# Patient Record
Sex: Female | Born: 1935 | Race: White | Hispanic: No | Marital: Married | Smoking: Never smoker
Health system: Southern US, Community
[De-identification: ages and names within clinical notes are randomized; demographics above are authoritative.]

## PROBLEM LIST (undated history)

## (undated) DIAGNOSIS — R3915 Urgency of urination: Secondary | ICD-10-CM

## (undated) DIAGNOSIS — M1990 Unspecified osteoarthritis, unspecified site: Secondary | ICD-10-CM

## (undated) DIAGNOSIS — I499 Cardiac arrhythmia, unspecified: Secondary | ICD-10-CM

## (undated) DIAGNOSIS — M19012 Primary osteoarthritis, left shoulder: Secondary | ICD-10-CM

## (undated) DIAGNOSIS — I341 Nonrheumatic mitral (valve) prolapse: Secondary | ICD-10-CM

## (undated) DIAGNOSIS — Z973 Presence of spectacles and contact lenses: Secondary | ICD-10-CM

## (undated) DIAGNOSIS — I639 Cerebral infarction, unspecified: Secondary | ICD-10-CM

## (undated) DIAGNOSIS — G629 Polyneuropathy, unspecified: Secondary | ICD-10-CM

## (undated) DIAGNOSIS — M19011 Primary osteoarthritis, right shoulder: Secondary | ICD-10-CM

## (undated) DIAGNOSIS — I350 Nonrheumatic aortic (valve) stenosis: Secondary | ICD-10-CM

## (undated) DIAGNOSIS — E785 Hyperlipidemia, unspecified: Secondary | ICD-10-CM

## (undated) DIAGNOSIS — I10 Essential (primary) hypertension: Secondary | ICD-10-CM

## (undated) HISTORY — PX: JOINT REPLACEMENT: SHX530

## (undated) HISTORY — PX: BACK SURGERY: SHX140

## (undated) HISTORY — PX: THROMBECTOMY: PRO61

## (undated) HISTORY — PX: COLONOSCOPY: SHX174

## (undated) HISTORY — PX: ANGIOPLASTY: SHX39

---

## 2011-05-23 DIAGNOSIS — M5020 Other cervical disc displacement, unspecified cervical region: Secondary | ICD-10-CM | POA: Diagnosis not present

## 2011-05-23 DIAGNOSIS — M999 Biomechanical lesion, unspecified: Secondary | ICD-10-CM | POA: Diagnosis not present

## 2011-05-23 DIAGNOSIS — M546 Pain in thoracic spine: Secondary | ICD-10-CM | POA: Diagnosis not present

## 2011-05-23 DIAGNOSIS — M9981 Other biomechanical lesions of cervical region: Secondary | ICD-10-CM | POA: Diagnosis not present

## 2011-05-24 DIAGNOSIS — M546 Pain in thoracic spine: Secondary | ICD-10-CM | POA: Diagnosis not present

## 2011-05-24 DIAGNOSIS — M9981 Other biomechanical lesions of cervical region: Secondary | ICD-10-CM | POA: Diagnosis not present

## 2011-05-24 DIAGNOSIS — M999 Biomechanical lesion, unspecified: Secondary | ICD-10-CM | POA: Diagnosis not present

## 2011-05-24 DIAGNOSIS — M5020 Other cervical disc displacement, unspecified cervical region: Secondary | ICD-10-CM | POA: Diagnosis not present

## 2011-05-25 DIAGNOSIS — M9981 Other biomechanical lesions of cervical region: Secondary | ICD-10-CM | POA: Diagnosis not present

## 2011-05-25 DIAGNOSIS — M5020 Other cervical disc displacement, unspecified cervical region: Secondary | ICD-10-CM | POA: Diagnosis not present

## 2011-05-25 DIAGNOSIS — M546 Pain in thoracic spine: Secondary | ICD-10-CM | POA: Diagnosis not present

## 2011-05-25 DIAGNOSIS — M999 Biomechanical lesion, unspecified: Secondary | ICD-10-CM | POA: Diagnosis not present

## 2011-05-27 DIAGNOSIS — M5020 Other cervical disc displacement, unspecified cervical region: Secondary | ICD-10-CM | POA: Diagnosis not present

## 2011-05-27 DIAGNOSIS — M999 Biomechanical lesion, unspecified: Secondary | ICD-10-CM | POA: Diagnosis not present

## 2011-05-27 DIAGNOSIS — M9981 Other biomechanical lesions of cervical region: Secondary | ICD-10-CM | POA: Diagnosis not present

## 2011-05-27 DIAGNOSIS — M546 Pain in thoracic spine: Secondary | ICD-10-CM | POA: Diagnosis not present

## 2011-05-30 DIAGNOSIS — M999 Biomechanical lesion, unspecified: Secondary | ICD-10-CM | POA: Diagnosis not present

## 2011-05-30 DIAGNOSIS — M546 Pain in thoracic spine: Secondary | ICD-10-CM | POA: Diagnosis not present

## 2011-05-30 DIAGNOSIS — M9981 Other biomechanical lesions of cervical region: Secondary | ICD-10-CM | POA: Diagnosis not present

## 2011-05-30 DIAGNOSIS — M5020 Other cervical disc displacement, unspecified cervical region: Secondary | ICD-10-CM | POA: Diagnosis not present

## 2011-05-31 DIAGNOSIS — M999 Biomechanical lesion, unspecified: Secondary | ICD-10-CM | POA: Diagnosis not present

## 2011-05-31 DIAGNOSIS — M546 Pain in thoracic spine: Secondary | ICD-10-CM | POA: Diagnosis not present

## 2011-05-31 DIAGNOSIS — M5020 Other cervical disc displacement, unspecified cervical region: Secondary | ICD-10-CM | POA: Diagnosis not present

## 2011-05-31 DIAGNOSIS — M9981 Other biomechanical lesions of cervical region: Secondary | ICD-10-CM | POA: Diagnosis not present

## 2011-06-03 DIAGNOSIS — M999 Biomechanical lesion, unspecified: Secondary | ICD-10-CM | POA: Diagnosis not present

## 2011-06-03 DIAGNOSIS — M546 Pain in thoracic spine: Secondary | ICD-10-CM | POA: Diagnosis not present

## 2011-06-03 DIAGNOSIS — M5020 Other cervical disc displacement, unspecified cervical region: Secondary | ICD-10-CM | POA: Diagnosis not present

## 2011-06-03 DIAGNOSIS — M9981 Other biomechanical lesions of cervical region: Secondary | ICD-10-CM | POA: Diagnosis not present

## 2011-06-05 DIAGNOSIS — M999 Biomechanical lesion, unspecified: Secondary | ICD-10-CM | POA: Diagnosis not present

## 2011-06-05 DIAGNOSIS — M546 Pain in thoracic spine: Secondary | ICD-10-CM | POA: Diagnosis not present

## 2011-06-05 DIAGNOSIS — M9981 Other biomechanical lesions of cervical region: Secondary | ICD-10-CM | POA: Diagnosis not present

## 2011-06-05 DIAGNOSIS — M5020 Other cervical disc displacement, unspecified cervical region: Secondary | ICD-10-CM | POA: Diagnosis not present

## 2011-06-07 DIAGNOSIS — M5020 Other cervical disc displacement, unspecified cervical region: Secondary | ICD-10-CM | POA: Diagnosis not present

## 2011-06-07 DIAGNOSIS — M546 Pain in thoracic spine: Secondary | ICD-10-CM | POA: Diagnosis not present

## 2011-06-07 DIAGNOSIS — M999 Biomechanical lesion, unspecified: Secondary | ICD-10-CM | POA: Diagnosis not present

## 2011-06-07 DIAGNOSIS — M9981 Other biomechanical lesions of cervical region: Secondary | ICD-10-CM | POA: Diagnosis not present

## 2011-06-11 DIAGNOSIS — M546 Pain in thoracic spine: Secondary | ICD-10-CM | POA: Diagnosis not present

## 2011-06-11 DIAGNOSIS — M5020 Other cervical disc displacement, unspecified cervical region: Secondary | ICD-10-CM | POA: Diagnosis not present

## 2011-06-11 DIAGNOSIS — M999 Biomechanical lesion, unspecified: Secondary | ICD-10-CM | POA: Diagnosis not present

## 2011-06-11 DIAGNOSIS — M9981 Other biomechanical lesions of cervical region: Secondary | ICD-10-CM | POA: Diagnosis not present

## 2011-06-12 DIAGNOSIS — M5020 Other cervical disc displacement, unspecified cervical region: Secondary | ICD-10-CM | POA: Diagnosis not present

## 2011-06-12 DIAGNOSIS — M546 Pain in thoracic spine: Secondary | ICD-10-CM | POA: Diagnosis not present

## 2011-06-12 DIAGNOSIS — M9981 Other biomechanical lesions of cervical region: Secondary | ICD-10-CM | POA: Diagnosis not present

## 2011-06-12 DIAGNOSIS — M999 Biomechanical lesion, unspecified: Secondary | ICD-10-CM | POA: Diagnosis not present

## 2011-06-14 DIAGNOSIS — M5020 Other cervical disc displacement, unspecified cervical region: Secondary | ICD-10-CM | POA: Diagnosis not present

## 2011-06-14 DIAGNOSIS — M9981 Other biomechanical lesions of cervical region: Secondary | ICD-10-CM | POA: Diagnosis not present

## 2011-06-14 DIAGNOSIS — M999 Biomechanical lesion, unspecified: Secondary | ICD-10-CM | POA: Diagnosis not present

## 2011-06-14 DIAGNOSIS — M546 Pain in thoracic spine: Secondary | ICD-10-CM | POA: Diagnosis not present

## 2011-06-18 DIAGNOSIS — M5020 Other cervical disc displacement, unspecified cervical region: Secondary | ICD-10-CM | POA: Diagnosis not present

## 2011-06-18 DIAGNOSIS — M999 Biomechanical lesion, unspecified: Secondary | ICD-10-CM | POA: Diagnosis not present

## 2011-06-18 DIAGNOSIS — M546 Pain in thoracic spine: Secondary | ICD-10-CM | POA: Diagnosis not present

## 2011-06-18 DIAGNOSIS — M9981 Other biomechanical lesions of cervical region: Secondary | ICD-10-CM | POA: Diagnosis not present

## 2011-06-20 DIAGNOSIS — M546 Pain in thoracic spine: Secondary | ICD-10-CM | POA: Diagnosis not present

## 2011-06-20 DIAGNOSIS — M999 Biomechanical lesion, unspecified: Secondary | ICD-10-CM | POA: Diagnosis not present

## 2011-06-20 DIAGNOSIS — M9981 Other biomechanical lesions of cervical region: Secondary | ICD-10-CM | POA: Diagnosis not present

## 2011-06-20 DIAGNOSIS — M5020 Other cervical disc displacement, unspecified cervical region: Secondary | ICD-10-CM | POA: Diagnosis not present

## 2011-06-24 DIAGNOSIS — M999 Biomechanical lesion, unspecified: Secondary | ICD-10-CM | POA: Diagnosis not present

## 2011-06-24 DIAGNOSIS — M5020 Other cervical disc displacement, unspecified cervical region: Secondary | ICD-10-CM | POA: Diagnosis not present

## 2011-06-24 DIAGNOSIS — M9981 Other biomechanical lesions of cervical region: Secondary | ICD-10-CM | POA: Diagnosis not present

## 2011-06-24 DIAGNOSIS — M546 Pain in thoracic spine: Secondary | ICD-10-CM | POA: Diagnosis not present

## 2011-06-28 DIAGNOSIS — M546 Pain in thoracic spine: Secondary | ICD-10-CM | POA: Diagnosis not present

## 2011-06-28 DIAGNOSIS — M5020 Other cervical disc displacement, unspecified cervical region: Secondary | ICD-10-CM | POA: Diagnosis not present

## 2011-06-28 DIAGNOSIS — M9981 Other biomechanical lesions of cervical region: Secondary | ICD-10-CM | POA: Diagnosis not present

## 2011-06-28 DIAGNOSIS — M999 Biomechanical lesion, unspecified: Secondary | ICD-10-CM | POA: Diagnosis not present

## 2011-07-01 DIAGNOSIS — M546 Pain in thoracic spine: Secondary | ICD-10-CM | POA: Diagnosis not present

## 2011-07-01 DIAGNOSIS — M999 Biomechanical lesion, unspecified: Secondary | ICD-10-CM | POA: Diagnosis not present

## 2011-07-01 DIAGNOSIS — M9981 Other biomechanical lesions of cervical region: Secondary | ICD-10-CM | POA: Diagnosis not present

## 2011-07-01 DIAGNOSIS — M5020 Other cervical disc displacement, unspecified cervical region: Secondary | ICD-10-CM | POA: Diagnosis not present

## 2011-07-05 DIAGNOSIS — M9981 Other biomechanical lesions of cervical region: Secondary | ICD-10-CM | POA: Diagnosis not present

## 2011-07-05 DIAGNOSIS — M546 Pain in thoracic spine: Secondary | ICD-10-CM | POA: Diagnosis not present

## 2011-07-05 DIAGNOSIS — M999 Biomechanical lesion, unspecified: Secondary | ICD-10-CM | POA: Diagnosis not present

## 2011-07-05 DIAGNOSIS — M5020 Other cervical disc displacement, unspecified cervical region: Secondary | ICD-10-CM | POA: Diagnosis not present

## 2011-07-11 DIAGNOSIS — M546 Pain in thoracic spine: Secondary | ICD-10-CM | POA: Diagnosis not present

## 2011-07-11 DIAGNOSIS — M9981 Other biomechanical lesions of cervical region: Secondary | ICD-10-CM | POA: Diagnosis not present

## 2011-07-11 DIAGNOSIS — M5020 Other cervical disc displacement, unspecified cervical region: Secondary | ICD-10-CM | POA: Diagnosis not present

## 2011-07-11 DIAGNOSIS — M999 Biomechanical lesion, unspecified: Secondary | ICD-10-CM | POA: Diagnosis not present

## 2011-07-12 DIAGNOSIS — M999 Biomechanical lesion, unspecified: Secondary | ICD-10-CM | POA: Diagnosis not present

## 2011-07-12 DIAGNOSIS — M9981 Other biomechanical lesions of cervical region: Secondary | ICD-10-CM | POA: Diagnosis not present

## 2011-07-12 DIAGNOSIS — M5020 Other cervical disc displacement, unspecified cervical region: Secondary | ICD-10-CM | POA: Diagnosis not present

## 2011-07-12 DIAGNOSIS — M546 Pain in thoracic spine: Secondary | ICD-10-CM | POA: Diagnosis not present

## 2011-07-16 DIAGNOSIS — M999 Biomechanical lesion, unspecified: Secondary | ICD-10-CM | POA: Diagnosis not present

## 2011-07-16 DIAGNOSIS — M9981 Other biomechanical lesions of cervical region: Secondary | ICD-10-CM | POA: Diagnosis not present

## 2011-07-16 DIAGNOSIS — M5020 Other cervical disc displacement, unspecified cervical region: Secondary | ICD-10-CM | POA: Diagnosis not present

## 2011-07-16 DIAGNOSIS — M546 Pain in thoracic spine: Secondary | ICD-10-CM | POA: Diagnosis not present

## 2011-07-24 DIAGNOSIS — M9981 Other biomechanical lesions of cervical region: Secondary | ICD-10-CM | POA: Diagnosis not present

## 2011-07-24 DIAGNOSIS — M5020 Other cervical disc displacement, unspecified cervical region: Secondary | ICD-10-CM | POA: Diagnosis not present

## 2011-07-24 DIAGNOSIS — M546 Pain in thoracic spine: Secondary | ICD-10-CM | POA: Diagnosis not present

## 2011-07-24 DIAGNOSIS — M999 Biomechanical lesion, unspecified: Secondary | ICD-10-CM | POA: Diagnosis not present

## 2011-07-30 DIAGNOSIS — M999 Biomechanical lesion, unspecified: Secondary | ICD-10-CM | POA: Diagnosis not present

## 2011-07-30 DIAGNOSIS — M5020 Other cervical disc displacement, unspecified cervical region: Secondary | ICD-10-CM | POA: Diagnosis not present

## 2011-07-30 DIAGNOSIS — M546 Pain in thoracic spine: Secondary | ICD-10-CM | POA: Diagnosis not present

## 2011-07-30 DIAGNOSIS — M9981 Other biomechanical lesions of cervical region: Secondary | ICD-10-CM | POA: Diagnosis not present

## 2011-08-08 DIAGNOSIS — L821 Other seborrheic keratosis: Secondary | ICD-10-CM | POA: Diagnosis not present

## 2011-08-08 DIAGNOSIS — L820 Inflamed seborrheic keratosis: Secondary | ICD-10-CM | POA: Diagnosis not present

## 2011-08-08 DIAGNOSIS — D485 Neoplasm of uncertain behavior of skin: Secondary | ICD-10-CM | POA: Diagnosis not present

## 2011-08-08 DIAGNOSIS — I781 Nevus, non-neoplastic: Secondary | ICD-10-CM | POA: Diagnosis not present

## 2011-08-08 DIAGNOSIS — L723 Sebaceous cyst: Secondary | ICD-10-CM | POA: Diagnosis not present

## 2011-08-22 DIAGNOSIS — I10 Essential (primary) hypertension: Secondary | ICD-10-CM | POA: Diagnosis not present

## 2011-08-22 DIAGNOSIS — G609 Hereditary and idiopathic neuropathy, unspecified: Secondary | ICD-10-CM | POA: Diagnosis not present

## 2011-08-22 DIAGNOSIS — E669 Obesity, unspecified: Secondary | ICD-10-CM | POA: Diagnosis not present

## 2011-08-22 DIAGNOSIS — E785 Hyperlipidemia, unspecified: Secondary | ICD-10-CM | POA: Diagnosis not present

## 2011-08-22 DIAGNOSIS — M1990 Unspecified osteoarthritis, unspecified site: Secondary | ICD-10-CM | POA: Diagnosis not present

## 2011-09-30 DIAGNOSIS — M79609 Pain in unspecified limb: Secondary | ICD-10-CM | POA: Diagnosis not present

## 2011-09-30 DIAGNOSIS — M7750 Other enthesopathy of unspecified foot: Secondary | ICD-10-CM | POA: Diagnosis not present

## 2011-09-30 DIAGNOSIS — Q667 Congenital pes cavus, unspecified foot: Secondary | ICD-10-CM | POA: Diagnosis not present

## 2011-09-30 DIAGNOSIS — G608 Other hereditary and idiopathic neuropathies: Secondary | ICD-10-CM | POA: Diagnosis not present

## 2011-10-21 DIAGNOSIS — G2581 Restless legs syndrome: Secondary | ICD-10-CM | POA: Diagnosis not present

## 2011-10-21 DIAGNOSIS — G5600 Carpal tunnel syndrome, unspecified upper limb: Secondary | ICD-10-CM | POA: Diagnosis not present

## 2011-10-28 DIAGNOSIS — Z23 Encounter for immunization: Secondary | ICD-10-CM | POA: Diagnosis not present

## 2012-01-15 HISTORY — PX: CARPAL TUNNEL RELEASE: SHX101

## 2012-02-11 DIAGNOSIS — M12519 Traumatic arthropathy, unspecified shoulder: Secondary | ICD-10-CM | POA: Diagnosis not present

## 2012-02-11 DIAGNOSIS — M5412 Radiculopathy, cervical region: Secondary | ICD-10-CM | POA: Diagnosis not present

## 2012-02-11 DIAGNOSIS — G5600 Carpal tunnel syndrome, unspecified upper limb: Secondary | ICD-10-CM | POA: Diagnosis not present

## 2012-02-20 DIAGNOSIS — M542 Cervicalgia: Secondary | ICD-10-CM | POA: Diagnosis not present

## 2012-03-02 DIAGNOSIS — G5600 Carpal tunnel syndrome, unspecified upper limb: Secondary | ICD-10-CM | POA: Diagnosis not present

## 2012-03-04 DIAGNOSIS — M659 Synovitis and tenosynovitis, unspecified: Secondary | ICD-10-CM | POA: Diagnosis not present

## 2012-03-04 DIAGNOSIS — G5600 Carpal tunnel syndrome, unspecified upper limb: Secondary | ICD-10-CM | POA: Diagnosis not present

## 2012-04-02 DIAGNOSIS — L909 Atrophic disorder of skin, unspecified: Secondary | ICD-10-CM | POA: Diagnosis not present

## 2012-04-02 DIAGNOSIS — L821 Other seborrheic keratosis: Secondary | ICD-10-CM | POA: Diagnosis not present

## 2012-04-02 DIAGNOSIS — L259 Unspecified contact dermatitis, unspecified cause: Secondary | ICD-10-CM | POA: Diagnosis not present

## 2012-04-02 DIAGNOSIS — D239 Other benign neoplasm of skin, unspecified: Secondary | ICD-10-CM | POA: Diagnosis not present

## 2012-04-02 DIAGNOSIS — L919 Hypertrophic disorder of the skin, unspecified: Secondary | ICD-10-CM | POA: Diagnosis not present

## 2012-04-02 DIAGNOSIS — L723 Sebaceous cyst: Secondary | ICD-10-CM | POA: Diagnosis not present

## 2012-04-02 DIAGNOSIS — D485 Neoplasm of uncertain behavior of skin: Secondary | ICD-10-CM | POA: Diagnosis not present

## 2012-07-06 DIAGNOSIS — R350 Frequency of micturition: Secondary | ICD-10-CM | POA: Diagnosis not present

## 2012-07-06 DIAGNOSIS — N390 Urinary tract infection, site not specified: Secondary | ICD-10-CM | POA: Diagnosis not present

## 2012-07-08 DIAGNOSIS — H04129 Dry eye syndrome of unspecified lacrimal gland: Secondary | ICD-10-CM | POA: Diagnosis not present

## 2012-07-08 DIAGNOSIS — H2510 Age-related nuclear cataract, unspecified eye: Secondary | ICD-10-CM | POA: Diagnosis not present

## 2012-07-20 DIAGNOSIS — I10 Essential (primary) hypertension: Secondary | ICD-10-CM | POA: Diagnosis not present

## 2012-07-20 DIAGNOSIS — M1990 Unspecified osteoarthritis, unspecified site: Secondary | ICD-10-CM | POA: Diagnosis not present

## 2012-07-20 DIAGNOSIS — G609 Hereditary and idiopathic neuropathy, unspecified: Secondary | ICD-10-CM | POA: Diagnosis not present

## 2012-07-20 DIAGNOSIS — E559 Vitamin D deficiency, unspecified: Secondary | ICD-10-CM | POA: Diagnosis not present

## 2012-07-20 DIAGNOSIS — Z0000 Encounter for general adult medical examination without abnormal findings: Secondary | ICD-10-CM | POA: Diagnosis not present

## 2012-07-20 DIAGNOSIS — E785 Hyperlipidemia, unspecified: Secondary | ICD-10-CM | POA: Diagnosis not present

## 2012-07-20 DIAGNOSIS — R011 Cardiac murmur, unspecified: Secondary | ICD-10-CM | POA: Diagnosis not present

## 2012-08-04 DIAGNOSIS — R011 Cardiac murmur, unspecified: Secondary | ICD-10-CM | POA: Diagnosis not present

## 2012-09-23 DIAGNOSIS — I10 Essential (primary) hypertension: Secondary | ICD-10-CM | POA: Diagnosis not present

## 2012-09-23 DIAGNOSIS — G609 Hereditary and idiopathic neuropathy, unspecified: Secondary | ICD-10-CM | POA: Diagnosis not present

## 2012-09-23 DIAGNOSIS — R011 Cardiac murmur, unspecified: Secondary | ICD-10-CM | POA: Diagnosis not present

## 2012-11-06 DIAGNOSIS — Z23 Encounter for immunization: Secondary | ICD-10-CM | POA: Diagnosis not present

## 2013-01-18 DIAGNOSIS — M19019 Primary osteoarthritis, unspecified shoulder: Secondary | ICD-10-CM | POA: Diagnosis not present

## 2013-03-26 DIAGNOSIS — Z79899 Other long term (current) drug therapy: Secondary | ICD-10-CM | POA: Diagnosis not present

## 2013-03-26 DIAGNOSIS — G609 Hereditary and idiopathic neuropathy, unspecified: Secondary | ICD-10-CM | POA: Diagnosis not present

## 2013-03-26 DIAGNOSIS — I10 Essential (primary) hypertension: Secondary | ICD-10-CM | POA: Diagnosis not present

## 2013-03-26 DIAGNOSIS — E785 Hyperlipidemia, unspecified: Secondary | ICD-10-CM | POA: Diagnosis not present

## 2013-03-30 DIAGNOSIS — M19019 Primary osteoarthritis, unspecified shoulder: Secondary | ICD-10-CM | POA: Diagnosis not present

## 2013-05-12 DIAGNOSIS — D2360 Other benign neoplasm of skin of unspecified upper limb, including shoulder: Secondary | ICD-10-CM | POA: Diagnosis not present

## 2013-05-12 DIAGNOSIS — M19019 Primary osteoarthritis, unspecified shoulder: Secondary | ICD-10-CM | POA: Diagnosis not present

## 2013-05-12 DIAGNOSIS — L821 Other seborrheic keratosis: Secondary | ICD-10-CM | POA: Diagnosis not present

## 2013-05-12 DIAGNOSIS — L723 Sebaceous cyst: Secondary | ICD-10-CM | POA: Diagnosis not present

## 2013-05-12 DIAGNOSIS — L570 Actinic keratosis: Secondary | ICD-10-CM | POA: Diagnosis not present

## 2013-07-07 DIAGNOSIS — H2510 Age-related nuclear cataract, unspecified eye: Secondary | ICD-10-CM | POA: Diagnosis not present

## 2013-07-07 DIAGNOSIS — H04129 Dry eye syndrome of unspecified lacrimal gland: Secondary | ICD-10-CM | POA: Diagnosis not present

## 2013-11-03 DIAGNOSIS — E780 Pure hypercholesterolemia: Secondary | ICD-10-CM | POA: Diagnosis not present

## 2013-11-03 DIAGNOSIS — I10 Essential (primary) hypertension: Secondary | ICD-10-CM | POA: Diagnosis not present

## 2013-11-03 DIAGNOSIS — Z79899 Other long term (current) drug therapy: Secondary | ICD-10-CM | POA: Diagnosis not present

## 2013-11-03 DIAGNOSIS — Z23 Encounter for immunization: Secondary | ICD-10-CM | POA: Diagnosis not present

## 2013-11-03 DIAGNOSIS — G609 Hereditary and idiopathic neuropathy, unspecified: Secondary | ICD-10-CM | POA: Diagnosis not present

## 2014-03-29 ENCOUNTER — Other Ambulatory Visit: Payer: Self-pay | Admitting: Orthopedic Surgery

## 2014-03-29 DIAGNOSIS — M25512 Pain in left shoulder: Secondary | ICD-10-CM | POA: Diagnosis not present

## 2014-03-30 ENCOUNTER — Ambulatory Visit
Admission: RE | Admit: 2014-03-30 | Discharge: 2014-03-30 | Disposition: A | Discharge status: Home / Self Care | Payer: Medicare Other | Source: Ambulatory Visit | Attending: Orthopedic Surgery | Admitting: Orthopedic Surgery

## 2014-03-30 DIAGNOSIS — M19012 Primary osteoarthritis, left shoulder: Secondary | ICD-10-CM | POA: Diagnosis not present

## 2014-03-30 DIAGNOSIS — M25512 Pain in left shoulder: Secondary | ICD-10-CM

## 2014-04-01 ENCOUNTER — Other Ambulatory Visit: Payer: Self-pay | Admitting: Orthopedic Surgery

## 2014-04-06 DIAGNOSIS — I10 Essential (primary) hypertension: Secondary | ICD-10-CM | POA: Diagnosis not present

## 2014-04-12 ENCOUNTER — Encounter (HOSPITAL_BASED_OUTPATIENT_CLINIC_OR_DEPARTMENT_OTHER)
Admission: RE | Admit: 2014-04-12 | Discharge: 2014-04-12 | Disposition: A | Discharge status: Home / Self Care | Payer: Medicare Other | Source: Ambulatory Visit | Attending: Orthopedic Surgery | Admitting: Orthopedic Surgery

## 2014-04-12 ENCOUNTER — Other Ambulatory Visit: Payer: Self-pay

## 2014-04-12 ENCOUNTER — Encounter (HOSPITAL_BASED_OUTPATIENT_CLINIC_OR_DEPARTMENT_OTHER): Payer: Self-pay | Admitting: *Deleted

## 2014-04-12 DIAGNOSIS — Z01812 Encounter for preprocedural laboratory examination: Secondary | ICD-10-CM | POA: Diagnosis not present

## 2014-04-12 DIAGNOSIS — M19012 Primary osteoarthritis, left shoulder: Secondary | ICD-10-CM | POA: Diagnosis not present

## 2014-04-12 DIAGNOSIS — Z01810 Encounter for preprocedural cardiovascular examination: Secondary | ICD-10-CM | POA: Diagnosis not present

## 2014-04-12 LAB — BASIC METABOLIC PANEL
Anion gap: 7 (ref 5–15)
BUN: 13 mg/dL (ref 6–23)
CO2: 28 mmol/L (ref 19–32)
CREATININE: 0.68 mg/dL (ref 0.50–1.10)
Calcium: 9.3 mg/dL (ref 8.4–10.5)
Chloride: 101 mmol/L (ref 96–112)
GFR calc Af Amer: >90 mL/min (ref >90)
GFR calc non Af Amer: 82 mL/min — ABNORMAL LOW (ref >90)
Glucose, Bld: 112 mg/dL — ABNORMAL HIGH (ref 70–99)
Potassium: 3.9 mmol/L (ref 3.5–5.1)
SODIUM: 136 mmol/L (ref 135–145)

## 2014-04-12 LAB — SURGICAL PCR SCREEN
MRSA, PCR: NEGATIVE
Staphylococcus aureus: NEGATIVE

## 2014-04-12 NOTE — Progress Notes (Signed)
Pt understands and is agreeable.  I called and spoke with Dr Sheryn Bison [facility] Doctor) he will arrange for pt to see cardiologist  He will try to get cardiac clear for surgery before surgery date.  Will call with results .     Dr Mardelle Matte advised of the above.

## 2014-04-12 NOTE — Progress Notes (Signed)
EKG reviewed by Dr Al Corpus, he spoke with pt.  Dr Crew would like to have pt seen by cards prior to surgery.

## 2014-04-19 ENCOUNTER — Other Ambulatory Visit (HOSPITAL_COMMUNITY): Payer: Self-pay | Admitting: Cardiology

## 2014-04-19 DIAGNOSIS — Z01810 Encounter for preprocedural cardiovascular examination: Secondary | ICD-10-CM | POA: Diagnosis not present

## 2014-04-19 DIAGNOSIS — E780 Pure hypercholesterolemia: Secondary | ICD-10-CM | POA: Diagnosis not present

## 2014-04-19 DIAGNOSIS — I10 Essential (primary) hypertension: Secondary | ICD-10-CM | POA: Diagnosis not present

## 2014-04-19 DIAGNOSIS — R9431 Abnormal electrocardiogram [ECG] [EKG]: Secondary | ICD-10-CM | POA: Diagnosis not present

## 2014-04-21 ENCOUNTER — Encounter (HOSPITAL_COMMUNITY)
Admission: RE | Admit: 2014-04-21 | Discharge: 2014-04-21 | Disposition: A | Discharge status: Home / Self Care | Payer: Medicare Other | Source: Ambulatory Visit | Attending: Cardiology | Admitting: Cardiology

## 2014-04-21 ENCOUNTER — Other Ambulatory Visit: Payer: Self-pay

## 2014-04-21 DIAGNOSIS — Z01810 Encounter for preprocedural cardiovascular examination: Secondary | ICD-10-CM | POA: Insufficient documentation

## 2014-04-21 DIAGNOSIS — R079 Chest pain, unspecified: Secondary | ICD-10-CM | POA: Diagnosis not present

## 2014-04-21 MED ORDER — NITROGLYCERIN 0.4 MG SL SUBL
SUBLINGUAL_TABLET | SUBLINGUAL | Status: AC
  Filled 2014-04-21: qty 1

## 2014-04-21 MED ORDER — REGADENOSON 0.4 MG/5ML IV SOLN
0.4000 mg | Freq: Once | INTRAVENOUS | Status: AC
  Administered 2014-04-21: 0.4 mg via INTRAVENOUS

## 2014-04-21 MED ORDER — REGADENOSON 0.4 MG/5ML IV SOLN
INTRAVENOUS | Status: AC
  Filled 2014-04-21: qty 5

## 2014-04-21 MED ORDER — TECHNETIUM TC 99M SESTAMIBI GENERIC - CARDIOLITE
30.0000 | Freq: Once | INTRAVENOUS | Status: AC | PRN
  Administered 2014-04-21: 30 via INTRAVENOUS

## 2014-04-21 MED ORDER — REGADENOSON 0.4 MG/5ML IV SOLN: 0.4000 mg | Freq: Once | INTRAVENOUS | Status: DC

## 2014-04-21 MED ORDER — TECHNETIUM TC 99M SESTAMIBI GENERIC - CARDIOLITE
10.0000 | Freq: Once | INTRAVENOUS | Status: AC | PRN
  Administered 2014-04-21: 10 via INTRAVENOUS

## 2014-04-22 ENCOUNTER — Ambulatory Visit (HOSPITAL_COMMUNITY): Payer: Medicare Other

## 2014-04-22 ENCOUNTER — Encounter (HOSPITAL_BASED_OUTPATIENT_CLINIC_OR_DEPARTMENT_OTHER)
Admission: RE | Disposition: A | Discharge status: Other Acute Inpatient Hospital | Payer: Self-pay | Source: Ambulatory Visit | Attending: Orthopedic Surgery

## 2014-04-22 ENCOUNTER — Encounter (HOSPITAL_BASED_OUTPATIENT_CLINIC_OR_DEPARTMENT_OTHER): Payer: Self-pay | Admitting: *Deleted

## 2014-04-22 ENCOUNTER — Ambulatory Visit (HOSPITAL_BASED_OUTPATIENT_CLINIC_OR_DEPARTMENT_OTHER): Payer: Medicare Other | Admitting: Anesthesiology

## 2014-04-22 ENCOUNTER — Ambulatory Visit (HOSPITAL_BASED_OUTPATIENT_CLINIC_OR_DEPARTMENT_OTHER)
Admission: RE | Admit: 2014-04-22 | Discharge: 2014-04-22 | Disposition: A | Discharge status: Other Acute Inpatient Hospital | Payer: Medicare Other | Source: Ambulatory Visit | Attending: Emergency Medicine | Admitting: Emergency Medicine

## 2014-04-22 DIAGNOSIS — M19012 Primary osteoarthritis, left shoulder: Secondary | ICD-10-CM | POA: Diagnosis present

## 2014-04-22 DIAGNOSIS — G9389 Other specified disorders of brain: Secondary | ICD-10-CM | POA: Insufficient documentation

## 2014-04-22 DIAGNOSIS — M1990 Unspecified osteoarthritis, unspecified site: Secondary | ICD-10-CM | POA: Diagnosis not present

## 2014-04-22 DIAGNOSIS — Z96612 Presence of left artificial shoulder joint: Secondary | ICD-10-CM

## 2014-04-22 DIAGNOSIS — R4781 Slurred speech: Secondary | ICD-10-CM | POA: Diagnosis not present

## 2014-04-22 DIAGNOSIS — Z471 Aftercare following joint replacement surgery: Secondary | ICD-10-CM | POA: Diagnosis not present

## 2014-04-22 DIAGNOSIS — Z7982 Long term (current) use of aspirin: Secondary | ICD-10-CM | POA: Diagnosis not present

## 2014-04-22 DIAGNOSIS — I10 Essential (primary) hypertension: Secondary | ICD-10-CM | POA: Insufficient documentation

## 2014-04-22 DIAGNOSIS — I639 Cerebral infarction, unspecified: Secondary | ICD-10-CM

## 2014-04-22 DIAGNOSIS — M25512 Pain in left shoulder: Secondary | ICD-10-CM | POA: Diagnosis not present

## 2014-04-22 DIAGNOSIS — Z79899 Other long term (current) drug therapy: Secondary | ICD-10-CM | POA: Insufficient documentation

## 2014-04-22 DIAGNOSIS — G319 Degenerative disease of nervous system, unspecified: Secondary | ICD-10-CM | POA: Insufficient documentation

## 2014-04-22 DIAGNOSIS — I97821 Postprocedural cerebrovascular infarction during other surgery: Secondary | ICD-10-CM | POA: Insufficient documentation

## 2014-04-22 DIAGNOSIS — I341 Nonrheumatic mitral (valve) prolapse: Secondary | ICD-10-CM | POA: Diagnosis not present

## 2014-04-22 DIAGNOSIS — Z8673 Personal history of transient ischemic attack (TIA), and cerebral infarction without residual deficits: Secondary | ICD-10-CM | POA: Insufficient documentation

## 2014-04-22 DIAGNOSIS — R531 Weakness: Secondary | ICD-10-CM | POA: Diagnosis not present

## 2014-04-22 DIAGNOSIS — G8918 Other acute postprocedural pain: Secondary | ICD-10-CM | POA: Diagnosis not present

## 2014-04-22 HISTORY — DX: Unspecified osteoarthritis, unspecified site: M19.90

## 2014-04-22 HISTORY — DX: Essential (primary) hypertension: I10

## 2014-04-22 HISTORY — DX: Nonrheumatic mitral (valve) prolapse: I34.1

## 2014-04-22 HISTORY — DX: Primary osteoarthritis, left shoulder: M19.012

## 2014-04-22 HISTORY — PX: TOTAL SHOULDER ARTHROPLASTY: SHX126

## 2014-04-22 LAB — POCT HEMOGLOBIN-HEMACUE: Hemoglobin: 18.5 g/dL — ABNORMAL HIGH (ref 12.0–15.0)

## 2014-04-22 SURGERY — ARTHROPLASTY, SHOULDER, TOTAL
Anesthesia: Regional | Site: Shoulder | Laterality: Left

## 2014-04-22 MED ORDER — FENTANYL CITRATE 0.05 MG/ML IJ SOLN: 25.0000 ug | INTRAMUSCULAR | Status: DC | PRN

## 2014-04-22 MED ORDER — OCUVITE PO TABS
1.0000 | ORAL_TABLET | Freq: Every day | ORAL | Status: DC
  Filled 2014-04-22: qty 1

## 2014-04-22 MED ORDER — LACTATED RINGERS IV SOLN
INTRAVENOUS | Status: DC
  Administered 2014-04-22 (×2): via INTRAVENOUS

## 2014-04-22 MED ORDER — METHOCARBAMOL 1000 MG/10ML IJ SOLN
500.0000 mg | Freq: Four times a day (QID) | INTRAVENOUS | Status: DC | PRN
  Filled 2014-04-22: qty 5

## 2014-04-22 MED ORDER — HYDROCODONE-ACETAMINOPHEN 10-325 MG PO TABS: 1.0000 | ORAL_TABLET | Freq: Four times a day (QID) | ORAL | Status: DC | PRN

## 2014-04-22 MED ORDER — MIDAZOLAM HCL 2 MG/2ML IJ SOLN
INTRAMUSCULAR | Status: AC
  Filled 2014-04-22: qty 2

## 2014-04-22 MED ORDER — MORPHINE SULFATE 2 MG/ML IJ SOLN: 1.0000 mg | INTRAMUSCULAR | Status: DC | PRN

## 2014-04-22 MED ORDER — EPHEDRINE SULFATE 50 MG/ML IJ SOLN
INTRAMUSCULAR | Status: DC | PRN
  Administered 2014-04-22: 15 mg via INTRAVENOUS
  Administered 2014-04-22: 10 mg via INTRAVENOUS

## 2014-04-22 MED ORDER — POLYETHYLENE GLYCOL 3350 17 G PO PACK
17.0000 g | PACK | Freq: Every day | ORAL | Status: DC | PRN
  Filled 2014-04-22: qty 1

## 2014-04-22 MED ORDER — FENTANYL CITRATE 0.05 MG/ML IJ SOLN
INTRAMUSCULAR | Status: AC
Start: 2014-04-22 — End: 2014-04-22
  Filled 2014-04-22: qty 2

## 2014-04-22 MED ORDER — ASPIRIN 81 MG PO TABS: 81.0000 mg | ORAL_TABLET | Freq: Every day | ORAL | Status: DC

## 2014-04-22 MED ORDER — OXYCODONE HCL 5 MG PO TABS: 5.0000 mg | ORAL_TABLET | Freq: Once | ORAL | Status: DC | PRN

## 2014-04-22 MED ORDER — MIDAZOLAM HCL 2 MG/2ML IJ SOLN
1.0000 mg | INTRAMUSCULAR | Status: DC | PRN
  Administered 2014-04-22: 1 mg via INTRAVENOUS

## 2014-04-22 MED ORDER — METHOCARBAMOL 500 MG PO TABS: 500.0000 mg | ORAL_TABLET | Freq: Four times a day (QID) | ORAL | Status: DC | PRN

## 2014-04-22 MED ORDER — PROPOFOL 10 MG/ML IV BOLUS
INTRAVENOUS | Status: DC | PRN
  Administered 2014-04-22: 180 mg via INTRAVENOUS
  Administered 2014-04-22: 20 mg via INTRAVENOUS

## 2014-04-22 MED ORDER — BUPIVACAINE-EPINEPHRINE (PF) 0.5% -1:200000 IJ SOLN
INTRAMUSCULAR | Status: DC | PRN
  Administered 2014-04-22: 30 mL via PERINEURAL

## 2014-04-22 MED ORDER — ONDANSETRON HCL 4 MG PO TABS: 4.0000 mg | ORAL_TABLET | Freq: Four times a day (QID) | ORAL | Status: DC | PRN

## 2014-04-22 MED ORDER — ATORVASTATIN CALCIUM 20 MG PO TABS
20.0000 mg | ORAL_TABLET | Freq: Every day | ORAL | Status: DC
  Filled 2014-04-22: qty 1

## 2014-04-22 MED ORDER — PHENYLEPHRINE HCL 10 MG/ML IJ SOLN
10.0000 mg | INTRAVENOUS | Status: DC | PRN
  Administered 2014-04-22: 50 ug/min via INTRAVENOUS

## 2014-04-22 MED ORDER — CEFAZOLIN SODIUM-DEXTROSE 2-3 GM-% IV SOLR
2.0000 g | INTRAVENOUS | Status: AC
  Administered 2014-04-22: 2 g via INTRAVENOUS

## 2014-04-22 MED ORDER — PREGABALIN 75 MG PO CAPS: 75.0000 mg | ORAL_CAPSULE | Freq: Two times a day (BID) | ORAL | Status: DC

## 2014-04-22 MED ORDER — CEFAZOLIN SODIUM-DEXTROSE 2-3 GM-% IV SOLR
INTRAVENOUS | Status: AC
  Filled 2014-04-22: qty 50

## 2014-04-22 MED ORDER — MAGNESIUM CITRATE PO SOLN: 1.0000 | Freq: Once | ORAL | Status: DC | PRN

## 2014-04-22 MED ORDER — HYDROCODONE-ACETAMINOPHEN 10-325 MG PO TABS
1.0000 | ORAL_TABLET | ORAL | Status: DC | PRN
  Administered 2014-04-22 – 2014-04-23 (×3): 1 via ORAL
  Filled 2014-04-22 (×3): qty 1

## 2014-04-22 MED ORDER — ONDANSETRON HCL 4 MG/2ML IJ SOLN
INTRAMUSCULAR | Status: DC | PRN
Start: 2014-04-22 — End: 2014-04-22
  Administered 2014-04-22: 4 mg via INTRAVENOUS

## 2014-04-22 MED ORDER — LISINOPRIL 20 MG PO TABS: 20.0000 mg | ORAL_TABLET | Freq: Every day | ORAL | Status: DC

## 2014-04-22 MED ORDER — PHENYLEPHRINE HCL 10 MG/ML IJ SOLN
INTRAMUSCULAR | Status: DC | PRN
  Administered 2014-04-22: 20 ug via INTRAVENOUS

## 2014-04-22 MED ORDER — BISACODYL 10 MG RE SUPP: 10.0000 mg | Freq: Every day | RECTAL | Status: DC | PRN

## 2014-04-22 MED ORDER — SODIUM CHLORIDE 0.9 % IV SOLN
INTRAVENOUS | Status: DC
  Administered 2014-04-22: 11:00:00 via INTRAVENOUS

## 2014-04-22 MED ORDER — SENNA-DOCUSATE SODIUM 8.6-50 MG PO TABS
2.0000 | ORAL_TABLET | Freq: Every day | ORAL | Status: DC
Start: 2014-04-22 — End: 2014-09-21

## 2014-04-22 MED ORDER — VITAMIN D 1000 UNITS PO TABS: 500.0000 [IU] | ORAL_TABLET | Freq: Every day | ORAL | Status: DC

## 2014-04-22 MED ORDER — DOCUSATE SODIUM 100 MG PO CAPS
100.0000 mg | ORAL_CAPSULE | Freq: Two times a day (BID) | ORAL | Status: DC
  Administered 2014-04-22: 100 mg via ORAL
  Filled 2014-04-22: qty 1

## 2014-04-22 MED ORDER — BACLOFEN 10 MG PO TABS: 10.0000 mg | ORAL_TABLET | Freq: Three times a day (TID) | ORAL | Status: DC

## 2014-04-22 MED ORDER — FENTANYL CITRATE 0.05 MG/ML IJ SOLN
INTRAMUSCULAR | Status: AC
  Filled 2014-04-22: qty 2

## 2014-04-22 MED ORDER — SODIUM CHLORIDE 0.9 % IV SOLN
INTRAVENOUS | Status: DC | PRN
  Administered 2014-04-22: 1000 mL

## 2014-04-22 MED ORDER — SENNA 8.6 MG PO TABS
1.0000 | ORAL_TABLET | Freq: Two times a day (BID) | ORAL | Status: DC
  Administered 2014-04-22: 8.6 mg via ORAL
  Filled 2014-04-22 (×3): qty 1

## 2014-04-22 MED ORDER — DEXAMETHASONE SODIUM PHOSPHATE 4 MG/ML IJ SOLN
INTRAMUSCULAR | Status: DC | PRN
  Administered 2014-04-22: 10 mg via INTRAVENOUS

## 2014-04-22 MED ORDER — ONDANSETRON HCL 4 MG/2ML IJ SOLN: 4.0000 mg | Freq: Four times a day (QID) | INTRAMUSCULAR | Status: DC | PRN

## 2014-04-22 MED ORDER — LIDOCAINE HCL (CARDIAC) 20 MG/ML IV SOLN
INTRAVENOUS | Status: DC | PRN
  Administered 2014-04-22: 40 mg via INTRAVENOUS

## 2014-04-22 MED ORDER — OXYCODONE HCL 5 MG/5ML PO SOLN: 5.0000 mg | Freq: Once | ORAL | Status: DC | PRN

## 2014-04-22 MED ORDER — CEFAZOLIN SODIUM 1-5 GM-% IV SOLN
1.0000 g | Freq: Four times a day (QID) | INTRAVENOUS | Status: AC
  Administered 2014-04-22 – 2014-04-23 (×3): 1 g via INTRAVENOUS

## 2014-04-22 MED ORDER — ZOLPIDEM TARTRATE 5 MG PO TABS: 5.0000 mg | ORAL_TABLET | Freq: Every evening | ORAL | Status: DC | PRN

## 2014-04-22 MED ORDER — ONDANSETRON HCL 4 MG PO TABS: 4.0000 mg | ORAL_TABLET | Freq: Three times a day (TID) | ORAL | Status: DC | PRN

## 2014-04-22 MED ORDER — HYDROCODONE-ACETAMINOPHEN 5-325 MG PO TABS
ORAL_TABLET | ORAL | Status: AC
  Filled 2014-04-22: qty 1

## 2014-04-22 MED ORDER — CEFAZOLIN SODIUM 1-5 GM-% IV SOLN
INTRAVENOUS | Status: AC
  Filled 2014-04-22: qty 50

## 2014-04-22 MED ORDER — SUCCINYLCHOLINE CHLORIDE 20 MG/ML IJ SOLN
INTRAMUSCULAR | Status: DC | PRN
  Administered 2014-04-22: 100 mg via INTRAVENOUS

## 2014-04-22 MED ORDER — FENTANYL CITRATE 0.05 MG/ML IJ SOLN
50.0000 ug | INTRAMUSCULAR | Status: DC | PRN
  Administered 2014-04-22: 50 ug via INTRAVENOUS

## 2014-04-22 MED ORDER — FENTANYL CITRATE 0.05 MG/ML IJ SOLN
INTRAMUSCULAR | Status: DC | PRN
  Administered 2014-04-22: 25 ug via INTRAVENOUS

## 2014-04-22 SURGICAL SUPPLY — 78 items
ACCESS 3.2MM STEINMAN PIN 9" ×3 IMPLANT
BLADE SAW SAG 29X58X.64 (BLADE) ×3 IMPLANT
BLADE SURG 10 STRL SS (BLADE) ×3 IMPLANT
BLADE SURG 15 STRL LF DISP TIS (BLADE) ×2 IMPLANT
BLADE SURG 15 STRL SS (BLADE) ×4
BOWL SMART MIX CTS (DISPOSABLE) IMPLANT
CAPT SHLDR TOTAL 2 ×3 IMPLANT
CEMENT HV SMART SET (Cement) ×3 IMPLANT
CLEANER CAUTERY TIP 5X5 PAD (MISCELLANEOUS) IMPLANT
CLOSURE STERI-STRIP 1/2X4 (GAUZE/BANDAGES/DRESSINGS) ×1
CLSR STERI-STRIP ANTIMIC 1/2X4 (GAUZE/BANDAGES/DRESSINGS) ×2 IMPLANT
COVER BACK TABLE 80X110 HD (DRAPES) ×3 IMPLANT
COVER MAYO STAND STRL (DRAPES) ×3 IMPLANT
DECANTER SPIKE VIAL GLASS SM (MISCELLANEOUS) IMPLANT
DRAPE INCISE IOBAN 66X45 STRL (DRAPES) IMPLANT
DRAPE SURG 17X23 STRL (DRAPES) ×3 IMPLANT
DRAPE U 20/CS (DRAPES) ×3 IMPLANT
DRAPE U-SHAPE 47X51 STRL (DRAPES) ×3 IMPLANT
DRAPE U-SHAPE 76X120 STRL (DRAPES) ×6 IMPLANT
DRSG MEPILEX BORDER 4X8 (GAUZE/BANDAGES/DRESSINGS) ×3 IMPLANT
DURAPREP 26ML APPLICATOR (WOUND CARE) ×3 IMPLANT
ELECT BLADE 6.5 .24CM SHAFT (ELECTRODE) IMPLANT
ELECT REM PT RETURN 9FT ADLT (ELECTROSURGICAL) ×3
ELECTRODE REM PT RTRN 9FT ADLT (ELECTROSURGICAL) ×1 IMPLANT
FACESHIELD WRAPAROUND (MASK) ×6 IMPLANT
GAUZE SPONGE 4X4 12PLY STRL (GAUZE/BANDAGES/DRESSINGS) ×3 IMPLANT
GLOVE BIO SURGEON STRL SZ8 (GLOVE) ×3 IMPLANT
GLOVE BIOGEL PI IND STRL 7.0 (GLOVE) ×3 IMPLANT
GLOVE BIOGEL PI IND STRL 8 (GLOVE) ×2 IMPLANT
GLOVE BIOGEL PI INDICATOR 7.0 (GLOVE) ×6
GLOVE BIOGEL PI INDICATOR 8 (GLOVE) ×4
GLOVE ECLIPSE 6.5 STRL STRAW (GLOVE) ×9 IMPLANT
GLOVE ORTHO TXT STRL SZ7.5 (GLOVE) ×3 IMPLANT
GOWN STRL REUS W/ TWL LRG LVL3 (GOWN DISPOSABLE) ×2 IMPLANT
GOWN STRL REUS W/ TWL XL LVL3 (GOWN DISPOSABLE) ×2 IMPLANT
GOWN STRL REUS W/TWL LRG LVL3 (GOWN DISPOSABLE) ×4
GOWN STRL REUS W/TWL XL LVL3 (GOWN DISPOSABLE) ×4
HANDPIECE INTERPULSE COAX TIP (DISPOSABLE)
MANIFOLD NEPTUNE II (INSTRUMENTS) ×3 IMPLANT
NDL SUT 6 .5 CRC .975X.05 MAYO (NEEDLE) ×1 IMPLANT
NEEDLE MAYO TAPER (NEEDLE) ×2
NS IRRIG 1000ML POUR BTL (IV SOLUTION) ×3 IMPLANT
PACK ARTHROSCOPY DSU (CUSTOM PROCEDURE TRAY) ×3 IMPLANT
PACK BASIN DAY SURGERY FS (CUSTOM PROCEDURE TRAY) ×3 IMPLANT
PAD CLEANER CAUTERY TIP 5X5 (MISCELLANEOUS)
PENCIL BUTTON HOLSTER BLD 10FT (ELECTRODE) ×3 IMPLANT
RETRIEVER SUT HEWSON (MISCELLANEOUS) IMPLANT
SET HNDPC FAN SPRY TIP SCT (DISPOSABLE) IMPLANT
SHEET MEDIUM DRAPE 40X70 STRL (DRAPES) ×3 IMPLANT
SLEEVE SCD COMPRESS KNEE MED (MISCELLANEOUS) ×3 IMPLANT
SLING ARM IMMOBILIZER LRG (SOFTGOODS) IMPLANT
SLING ARM IMMOBILIZER MED (SOFTGOODS) IMPLANT
SLING ARM LRG ADULT FOAM STRAP (SOFTGOODS) ×3 IMPLANT
SLING ARM MED ADULT FOAM STRAP (SOFTGOODS) IMPLANT
SLING ARM XL FOAM STRAP (SOFTGOODS) IMPLANT
SMARTMIX MINI TOWER (MISCELLANEOUS) ×3
SPONGE LAP 18X18 X RAY DECT (DISPOSABLE) ×3 IMPLANT
SPONGE LAP 4X18 X RAY DECT (DISPOSABLE) ×3 IMPLANT
SUCTION FRAZIER TIP 10 FR DISP (SUCTIONS) ×3 IMPLANT
SUPPORT WRAP ARM LG (MISCELLANEOUS) ×3 IMPLANT
SUT FIBERWIRE #2 38 T-5 BLUE (SUTURE) ×6
SUT MNCRL AB 4-0 PS2 18 (SUTURE) IMPLANT
SUT VIC AB 0 CT1 18XCR BRD 8 (SUTURE) IMPLANT
SUT VIC AB 0 CT1 27 (SUTURE) ×2
SUT VIC AB 0 CT1 27XBRD ANBCTR (SUTURE) ×1 IMPLANT
SUT VIC AB 0 CT1 8-18 (SUTURE)
SUT VIC AB 2-0 SH 27 (SUTURE)
SUT VIC AB 2-0 SH 27XBRD (SUTURE) IMPLANT
SUT VICRYL 3-0 CR8 SH (SUTURE) ×3 IMPLANT
SUTURE FIBERWR #2 38 T-5 BLUE (SUTURE) ×2 IMPLANT
SYR BULB IRRIGATION 50ML (SYRINGE) ×3 IMPLANT
TAPE STRIPS DRAPE STRL (GAUZE/BANDAGES/DRESSINGS) IMPLANT
TOWEL OR 17X24 6PK STRL BLUE (TOWEL DISPOSABLE) ×6 IMPLANT
TOWEL OR NON WOVEN STRL DISP B (DISPOSABLE) ×6 IMPLANT
TOWER SMARTMIX MINI (MISCELLANEOUS) ×1 IMPLANT
TUBE CONNECTING 20'X1/4 (TUBING) ×1
TUBE CONNECTING 20X1/4 (TUBING) ×2 IMPLANT
YANKAUER SUCT BULB TIP NO VENT (SUCTIONS) ×3 IMPLANT

## 2014-04-22 NOTE — Transfer of Care (Signed)
Immediate Anesthesia Transfer of Care Note  Patient: Peggy [name]  Procedure(s) Performed: Procedure(s): LEFT TOTAL SHOULDER ARTHROPLASTY (Left)  Patient Location: PACU  Anesthesia Type:General  Level of Consciousness: awake and sedated  Airway & Oxygen Therapy: Patient Spontanous Breathing and Patient connected to face mask oxygen  Post-op Assessment: Report given to RN and Post -op Vital signs reviewed and stable  Post vital signs: Reviewed and stable  Last Vitals:  Filed Vitals:   04/22/14 0720  BP: 109/67  Pulse: 77  Temp:   Resp: 19    Complications: No apparent anesthesia complications

## 2014-04-22 NOTE — Op Note (Signed)
04/22/2014  9:43 AM  PATIENT:  Peggy [name]    PRE-OPERATIVE DIAGNOSIS:  PRIMARY OSTEOARTHRITIS LEFT SHOULDER   POST-OPERATIVE DIAGNOSIS:  Same  PROCEDURE:  LEFT TOTAL SHOULDER ARTHROPLASTY  SURGEON:  Johnny Bridge, MD  PHYSICIAN ASSISTANT: Joya Gaskins, OPA-C, present and scrubbed throughout the case, critical for completion in a timely fashion, and for retraction, instrumentation, and closure.  ANESTHESIA:   General  PREOPERATIVE INDICATIONS:  Peggy [name] is a  80 y.o. female with a diagnosis of PRIMARY OSTEOARTHRITIS LEFT SHOULDER  who failed conservative measures and elected for surgical management.    The risks benefits and alternatives were discussed with the patient preoperatively including but not limited to the risks of infection, bleeding, nerve injury, cardiopulmonary complications, the need for revision surgery, dislocation, loosening, incomplete relief of pain, among others, and the patient was willing to proceed.   OPERATIVE IMPLANTS: Biomet size 10 mini press-fit humeral stem, size 46+18 Versa-dial humeral head, set in the A position with increased coverage superiorly, with a small cemented glenoid polyethylene 3 peg implant with a central regenerex noncemented post.   OPERATIVE FINDINGS: Advanced glenohumeral osteoarthritis involving the glenoid and the humeral head with substantial osteophyte formation inferiorly.The capsular tissue and subscapularis was very mobile..The glenoid bone was very sclerotic.   OPERATIVE PROCEDURE: The patient was brought to the operating room and placed in the supine position. General anesthesia was administered. IV antibiotics were given.  The upper extremity was prepped and draped in usual sterile fashion. The patient was in a beachchair position with all bony prominences padded.   Time out was performed and a deltopectoral approach was carried out. The biceps tendon was tenodesed to the pectoralis tendon. The subscapularis was  released, tagging it with a #2 MaxBraid, leaving a cuff of tendon for repair.   The inferior osteophyte was removed, and release of the capsule off of the humeral side was completed. The head was dislocated, and I reamed sequentially. I placed the humeral cutting guide at 30 of retroversion, and then pinned this into place, and made my humeral neck cut. This was at the appropriate level.   I then placed deep retractors and exposed the glenoid. I excised the labrum circumferentially, taking care to protect the axillary nerve inferiorly.   I then placed a guidewire into the center position, controlling appropriate version and inclination. I then reamed over the guidewire with the small reamer, and was satisfied with the preparation. I preserved the subchondral bone in order to maximize the strength and minimize the risk for subsequent subsidence.   I then drilled the central hole for the regenerex peg, and then placed the guide, and then drilled the 3 peripheral peg holes. I had excellent bony circumferential contact.   I then cleaned the glenoid, irrigated it copiously, and then dried it and cemented the prosthesis into place. Excellent seating was achieved. I had full exposure. I turned my attention to the humeral side.   I sequentially broached, up to the selected size, with the broach set at 30 of retroversion. I then placed the real stem. I trialed with multiple heads, and the above-named component was selected. Increased posterior coverage improved the coverage. The soft tissue tension was appropriate.   I then impacted the real humeral head into place, reduced the head, and irrigated copiously. Excellent stability and range of motion was achieved. I repaired the subscapularis through tendon with 5#2 FiberWire, as well as the rotator interval, and irrigated copiously once more. The subcutaneous tissue was closed  with Vicryl including the deltopectoral fascia.   The skin was closed with  Steri-Strips and sterile gauze was applied. She had a preoperative nerve block. She tolerated the procedure well and there were no complications.

## 2014-04-22 NOTE — Progress Notes (Signed)
Assisted Dr. Hodierne with left, ultrasound guided, interscalene  block. Side rails up, monitors on throughout procedure. See vital signs in flow sheet. Tolerated Procedure well. 

## 2014-04-22 NOTE — Discharge Instructions (Signed)
Diet: As you were doing prior to hospitalization  ° °Shower:  May shower but keep the wounds dry, use an occlusive plastic wrap, NO SOAKING IN TUB.  If the bandage gets wet, change with a clean dry gauze. ° °Dressing:  You may change your dressing 3-5 days after surgery.  Then change the dressing daily with sterile gauze dressing.   ° °There are sticky tapes (steri-strips) on your wounds and all the stitches are absorbable.  Leave the steri-strips in place when changing your dressings, they will peel off with time, usually 2-3 weeks. ° °Activity:  Increase activity slowly as tolerated, but follow the weight bearing instructions below.  No lifting or driving for 6 weeks. ° °Weight Bearing:   Sling at all times..   ° °To prevent constipation: you may use a stool softener such as - ° °Colace (over the counter) 100 mg by mouth twice a day  °Drink plenty of fluids (prune juice may be helpful) and high fiber foods °Miralax (over the counter) for constipation as needed.   ° °Itching:  If you experience itching with your medications, try taking only a single pain pill, or even half a pain pill at a time.  You may take up to 10 pain pills per day, and you can also use benadryl over the counter for itching or also to help with sleep.  ° °Precautions:  If you experience chest pain or shortness of breath - call 911 immediately for transfer to the hospital emergency department!! ° °If you develop a fever greater that 101 F, purulent drainage from wound, increased redness or drainage from wound, or calf pain -- Call the office at [phone]                                                °Follow- Up Appointment:  Please call for an appointment to be seen in 2 weeks [place] - [phone] ° ° ° ° ° °

## 2014-04-22 NOTE — Anesthesia Procedure Notes (Addendum)
Anesthesia Regional Block:  Interscalene brachial plexus block  Pre-Anesthetic Checklist: ,, timeout performed, Correct Patient, Correct Site, Correct Laterality, Correct Procedure, Correct Position, site marked, Risks and benefits discussed,  Surgical consent,  Pre-op evaluation,  At surgeon's request and post-op pain management  Laterality: Left  Prep: chloraprep       Needles:  Injection technique: Single-shot  Needle Type: Echogenic Stimulator Needle     Needle Length: 5cm 5 cm Needle Gauge: 22 and 22 G    Additional Needles:  Procedures: ultrasound guided (picture in chart) and nerve stimulator Interscalene brachial plexus block  Nerve Stimulator or Paresthesia:  Response: biceps flexion, 0.45 mA,   Additional Responses:   Narrative:  Start time: 04/22/2014 7:03 AM End time: 04/22/2014 7:18 AM Injection made incrementally with aspirations every 5 mL.  Performed by: Personally  Anesthesiologist: HODIERNE, ADAM  Additional Notes: Functioning IV was confirmed and monitors were applied.  A 58mm 22ga Arrow echogenic stimulator needle was used. Sterile prep and drape,hand hygiene and sterile gloves were used.  Negative aspiration and negative test dose prior to incremental administration of local anesthetic. The patient tolerated the procedure well.  Ultrasound guidance: relevent anatomy identified, needle position confirmed, local anesthetic spread visualized around nerve(s), vascular puncture avoided.  Image printed for medical record.    Procedure Name: Intubation Performed by: Terrance Mass Pre-anesthesia Checklist: Patient identified, Timeout performed, Emergency Drugs available, Suction available and Patient being monitored Patient Re-evaluated:Patient Re-evaluated prior to inductionOxygen Delivery Method: Circle system utilized Preoxygenation: Pre-oxygenation with 100% oxygen Intubation Type: IV induction Ventilation: Mask ventilation without  difficulty Laryngoscope Size: Miller and 2 Grade View: Grade II Tube type: Oral Tube size: 7.0 mm Number of attempts: 1 Airway Equipment and Method: Stylet Placement Confirmation: ETT inserted through vocal cords under direct vision,  breath sounds checked- equal and bilateral and positive ETCO2 Secured at: 22 cm Tube secured with: Tape Dental Injury: Teeth and Oropharynx as per pre-operative assessment

## 2014-04-22 NOTE — Anesthesia Preprocedure Evaluation (Signed)
Anesthesia Evaluation  Patient identified by MRN, date of birth, ID band Patient awake    Reviewed: Allergy & Precautions, NPO status , Patient's Chart, lab work & pertinent test results  Airway Mallampati: II   Neck ROM: full    Dental   Pulmonary neg pulmonary ROS,  breath sounds clear to auscultation        Cardiovascular hypertension, Rhythm:regular Rate:Normal     Neuro/Psych    GI/Hepatic   Endo/Other    Renal/GU      Musculoskeletal  (+) Arthritis -,   Abdominal   Peds  Hematology   Anesthesia Other Findings   Reproductive/Obstetrics                             Anesthesia Physical Anesthesia Plan  ASA: II  Anesthesia Plan: General and Regional   Post-op Pain Management: MAC Combined w/ Regional for Post-op pain   Induction: Intravenous  Airway Management Planned: Oral ETT  Additional Equipment:   Intra-op Plan:   Post-operative Plan: Extubation in OR  Informed Consent: I have reviewed the patients History and Physical, chart, labs and discussed the procedure including the risks, benefits and alternatives for the proposed anesthesia with the patient or authorized representative who has indicated his/her understanding and acceptance.     Plan Discussed with: CRNA, Anesthesiologist and Surgeon  Anesthesia Plan Comments:         Anesthesia Quick Evaluation

## 2014-04-22 NOTE — Anesthesia Postprocedure Evaluation (Signed)
Anesthesia Post Note  Patient: Peggy [name]  Procedure(s) Performed: Procedure(s) (LRB): LEFT TOTAL SHOULDER ARTHROPLASTY (Left)  Anesthesia type: General  Patient location: PACU  Post pain: Pain level controlled and Adequate analgesia  Post assessment: Post-op Vital signs reviewed, Patient's Cardiovascular Status Stable, Respiratory Function Stable, Patent Airway and Pain level controlled  Last Vitals:  Filed Vitals:   04/22/14 1115  BP: 109/65  Pulse: 90  Temp: 36.1 C  Resp: 20    Post vital signs: Reviewed and stable  Level of consciousness: awake, alert  and oriented  Complications: No apparent anesthesia complications

## 2014-04-22 NOTE — H&P (Signed)
PREOPERATIVE H&P  Chief Complaint: PRIMARY OSTEOARTHRITIS LEFT SHOULDER   HPI: Peggy [name] is a 80 y.o. female who presents for preoperative history and physical with a diagnosis of PRIMARY OSTEOARTHRITIS LEFT SHOULDER . Symptoms are rated as moderate to severe, and have been worsening.  This is significantly impairing activities of daily living.  She has elected for surgical management.   She has failed injections, activity modification, anti-inflammatories, and assistive devices.  Preoperative X-rays demonstrate end stage degenerative changes with osteophyte formation, loss of joint space, subchondral sclerosis.   Past Medical History  Diagnosis Date  . Hypertension   . Arthritis   . Mitral valve prolapse     takes antiabiotic with procedures   Past Surgical History  Procedure Laterality Date  . Joint replacement      bilat knees  . Back surgery      L5 removed 1986  . Carpal tunnel release Right 2014   History   Social History  . Marital Status: Married    Spouse Name: N/A  . Number of Children: N/A  . Years of Education: N/A   Social History Main Topics  . Smoking status: Never Smoker   . Smokeless tobacco: Never Used  . Alcohol Use: Yes     Comment: occasionally  . Drug Use: No  . Sexual Activity: Yes   Other Topics Concern  . None   Social History Narrative   History reviewed. No pertinent family history. No Known Allergies Prior to Admission medications   Medication Sig Start Date End Date Taking? Authorizing Provider  aspirin 81 MG tablet Take 81 mg by mouth daily.   Yes Historical Provider, MD  atorvastatin (LIPITOR) 20 MG tablet Take 20 mg by mouth daily.   Yes Historical Provider, MD  beta carotene w/minerals (OCUVITE) tablet Take 1 tablet by mouth daily.   Yes Historical Provider, MD  calcium carbonate 200 MG capsule Take 250 mg by mouth 2 (two) times daily with a meal.   Yes Historical Provider, MD  cholecalciferol (VITAMIN D) 1000 UNITS  tablet Take 500 Units by mouth daily.   Yes Historical Provider, MD  glucosamine-chondroitin 500-400 MG tablet Take 1 tablet by mouth 4 (four) times daily.   Yes Historical Provider, MD  lisinopril (PRINIVIL,ZESTRIL) 20 MG tablet Take 20 mg by mouth daily.   Yes Historical Provider, MD  pregabalin (LYRICA) 75 MG capsule Take 75 mg by mouth 2 (two) times daily.   Yes Historical Provider, MD  vitamin A 7500 UNIT capsule Take 7,500 Units by mouth daily.   Yes Historical Provider, MD     Positive ROS: All other systems have been reviewed and were otherwise negative with the exception of those mentioned in the HPI and as above.  Physical Exam: General: Alert, no acute distress Cardiovascular: No pedal edema Respiratory: No cyanosis, no use of accessory musculature GI: No organomegaly, abdomen is soft and non-tender Skin: No lesions in the area of chief complaint Neurologic: Sensation intact distally Psychiatric: Patient is competent for consent with normal mood and affect Lymphatic: No axillary or cervical lymphadenopathy  MUSCULOSKELETAL: Left shoulder active motion is 0-165 with 10 of external rotation with positive crepitance and intact rotator cuff strength.  Assessment: PRIMARY OSTEOARTHRITIS LEFT SHOULDER   Plan: Plan for Procedure(s): LEFT TOTAL SHOULDER ARTHROPLASTY  The risks benefits and alternatives were discussed with the patient including but not limited to the risks of nonoperative treatment, versus surgical intervention including infection, bleeding, nerve injury,  blood clots, cardiopulmonary complications, morbidity,  mortality, among others, and they were willing to proceed.   Johnny Bridge, MD Cell [phone]   04/22/2014 7:09 AM

## 2014-04-22 NOTE — Progress Notes (Signed)
Dr Mardelle Matte aware of sling no immobilizer on.  States did not need to change

## 2014-04-23 ENCOUNTER — Emergency Department (HOSPITAL_COMMUNITY)
Admission: EM | Admit: 2014-04-23 | Discharge: 2014-04-23 | Disposition: A | Discharge status: Home / Self Care | Payer: Medicare Other | Attending: Emergency Medicine | Admitting: Emergency Medicine

## 2014-04-23 ENCOUNTER — Emergency Department (HOSPITAL_COMMUNITY): Payer: Medicare Other

## 2014-04-23 ENCOUNTER — Encounter (HOSPITAL_COMMUNITY): Payer: Self-pay | Admitting: Emergency Medicine

## 2014-04-23 DIAGNOSIS — I63032 Cerebral infarction due to thrombosis of left carotid artery: Secondary | ICD-10-CM | POA: Diagnosis not present

## 2014-04-23 DIAGNOSIS — Z79899 Other long term (current) drug therapy: Secondary | ICD-10-CM | POA: Insufficient documentation

## 2014-04-23 DIAGNOSIS — M25512 Pain in left shoulder: Secondary | ICD-10-CM | POA: Diagnosis not present

## 2014-04-23 DIAGNOSIS — I10 Essential (primary) hypertension: Secondary | ICD-10-CM | POA: Insufficient documentation

## 2014-04-23 DIAGNOSIS — G8194 Hemiplegia, unspecified affecting left nondominant side: Secondary | ICD-10-CM | POA: Diagnosis not present

## 2014-04-23 DIAGNOSIS — Z96612 Presence of left artificial shoulder joint: Secondary | ICD-10-CM | POA: Diagnosis not present

## 2014-04-23 DIAGNOSIS — I639 Cerebral infarction, unspecified: Secondary | ICD-10-CM | POA: Diagnosis not present

## 2014-04-23 DIAGNOSIS — I63031 Cerebral infarction due to thrombosis of right carotid artery: Secondary | ICD-10-CM | POA: Diagnosis not present

## 2014-04-23 DIAGNOSIS — I959 Hypotension, unspecified: Secondary | ICD-10-CM | POA: Diagnosis not present

## 2014-04-23 DIAGNOSIS — I6521 Occlusion and stenosis of right carotid artery: Secondary | ICD-10-CM | POA: Diagnosis not present

## 2014-04-23 DIAGNOSIS — J189 Pneumonia, unspecified organism: Secondary | ICD-10-CM | POA: Diagnosis not present

## 2014-04-23 DIAGNOSIS — J90 Pleural effusion, not elsewhere classified: Secondary | ICD-10-CM | POA: Diagnosis not present

## 2014-04-23 DIAGNOSIS — R531 Weakness: Secondary | ICD-10-CM | POA: Diagnosis present

## 2014-04-23 DIAGNOSIS — Z7982 Long term (current) use of aspirin: Secondary | ICD-10-CM | POA: Insufficient documentation

## 2014-04-23 DIAGNOSIS — I63511 Cerebral infarction due to unspecified occlusion or stenosis of right middle cerebral artery: Secondary | ICD-10-CM | POA: Diagnosis not present

## 2014-04-23 DIAGNOSIS — M1990 Unspecified osteoarthritis, unspecified site: Secondary | ICD-10-CM | POA: Insufficient documentation

## 2014-04-23 DIAGNOSIS — R918 Other nonspecific abnormal finding of lung field: Secondary | ICD-10-CM | POA: Diagnosis not present

## 2014-04-23 DIAGNOSIS — Z0189 Encounter for other specified special examinations: Secondary | ICD-10-CM | POA: Diagnosis not present

## 2014-04-23 DIAGNOSIS — J811 Chronic pulmonary edema: Secondary | ICD-10-CM | POA: Diagnosis not present

## 2014-04-23 DIAGNOSIS — D649 Anemia, unspecified: Secondary | ICD-10-CM | POA: Diagnosis not present

## 2014-04-23 DIAGNOSIS — I63311 Cerebral infarction due to thrombosis of right middle cerebral artery: Secondary | ICD-10-CM | POA: Diagnosis not present

## 2014-04-23 DIAGNOSIS — I6601 Occlusion and stenosis of right middle cerebral artery: Secondary | ICD-10-CM | POA: Diagnosis not present

## 2014-04-23 DIAGNOSIS — I341 Nonrheumatic mitral (valve) prolapse: Secondary | ICD-10-CM | POA: Diagnosis not present

## 2014-04-23 HISTORY — DX: Cerebral infarction, unspecified: I63.9

## 2014-04-23 LAB — COMPREHENSIVE METABOLIC PANEL
ALT: 18 U/L (ref 0–35)
AST: 23 U/L (ref 0–37)
Albumin: 3.5 g/dL (ref 3.5–5.2)
Alkaline Phosphatase: 57 U/L (ref 39–117)
Anion gap: 6 (ref 5–15)
BUN: 15 mg/dL (ref 6–23)
CALCIUM: 8.8 mg/dL (ref 8.4–10.5)
CO2: 29 mmol/L (ref 19–32)
CREATININE: 0.73 mg/dL (ref 0.50–1.10)
Chloride: 102 mmol/L (ref 96–112)
GFR calc Af Amer: >90 mL/min (ref >90)
GFR calc non Af Amer: 80 mL/min — ABNORMAL LOW (ref >90)
GLUCOSE: 121 mg/dL — AB (ref 70–99)
POTASSIUM: 4.3 mmol/L (ref 3.5–5.1)
Sodium: 137 mmol/L (ref 135–145)
Total Bilirubin: 0.5 mg/dL (ref 0.3–1.2)
Total Protein: 6.7 g/dL (ref 6.0–8.3)

## 2014-04-23 LAB — PROTIME-INR
INR: 1.05 (ref 0.00–1.49)
Prothrombin Time: 13.8 seconds (ref 11.6–15.2)

## 2014-04-23 LAB — DIFFERENTIAL
BASOS ABS: 0 10*3/uL (ref 0.0–0.1)
Basophils Relative: 0 % (ref 0–1)
EOS ABS: 0 10*3/uL (ref 0.0–0.7)
Eosinophils Relative: 0 % (ref 0–5)
Lymphocytes Relative: 11 % — ABNORMAL LOW (ref 12–46)
Lymphs Abs: 1.7 10*3/uL (ref 0.7–4.0)
Monocytes Absolute: 1.8 10*3/uL — ABNORMAL HIGH (ref 0.1–1.0)
Monocytes Relative: 11 % (ref 3–12)
NEUTROS ABS: 12.7 10*3/uL — AB (ref 1.7–7.7)
NEUTROS PCT: 78 % — AB (ref 43–77)

## 2014-04-23 LAB — I-STAT CHEM 8, ED
BUN: 17 mg/dL (ref 6–23)
Calcium, Ion: 1.12 mmol/L — ABNORMAL LOW (ref 1.13–1.30)
Chloride: 101 mmol/L (ref 96–112)
Creatinine, Ser: 0.7 mg/dL (ref 0.50–1.10)
GLUCOSE: 125 mg/dL — AB (ref 70–99)
HEMATOCRIT: 43 % (ref 36.0–46.0)
Hemoglobin: 14.6 g/dL (ref 12.0–15.0)
Potassium: 4.3 mmol/L (ref 3.5–5.1)
Sodium: 139 mmol/L (ref 135–145)
TCO2: 25 mmol/L (ref 0–100)

## 2014-04-23 LAB — ETHANOL: Alcohol, Ethyl (B): <5 mg/dL (ref 0–9)

## 2014-04-23 LAB — CBC
HCT: 41.5 % (ref 36.0–46.0)
Hemoglobin: 13.5 g/dL (ref 12.0–15.0)
MCH: 29.7 pg (ref 26.0–34.0)
MCHC: 32.5 g/dL (ref 30.0–36.0)
MCV: 91.2 fL (ref 78.0–100.0)
Platelets: 272 10*3/uL (ref 150–400)
RBC: 4.55 MIL/uL (ref 3.87–5.11)
RDW: 12.9 % (ref 11.5–15.5)
WBC: 16.2 10*3/uL — ABNORMAL HIGH (ref 4.0–10.5)

## 2014-04-23 LAB — APTT: aPTT: 25 seconds (ref 24–37)

## 2014-04-23 LAB — I-STAT TROPONIN, ED: Troponin i, poc: 0 ng/mL (ref 0.00–0.08)

## 2014-04-23 LAB — CBG MONITORING, ED: GLUCOSE-CAPILLARY: 127 mg/dL — AB (ref 70–99)

## 2014-04-23 MED ORDER — ASPIRIN 81 MG PO CHEW: 81.0000 mg | CHEWABLE_TABLET | Freq: Every day | ORAL | Status: DC

## 2014-04-23 NOTE — ED Provider Notes (Signed)
CSN: [id number]     Arrival date & time 04/23/14  0542 History   First MD Initiated Contact with Patient 04/23/14 0544     Chief Complaint  Patient presents with  . Code Stroke     (Consider location/radiation/quality/duration/timing/severity/associated sxs/prior Treatment) HPI Patient with complete left shoulder replacement surgery yesterday presents with acute onset left sided weakness starting at 4:55 AM. Patient is nonverbal at this time. Level V caveat applies. Past Medical History  Diagnosis Date  . Hypertension   . Arthritis   . Mitral valve prolapse     takes antiabiotic with procedures  . Osteoarthritis of left shoulder, primary localized 04/22/2014   Past Surgical History  Procedure Laterality Date  . Joint replacement      bilat knees  . Back surgery      L5 removed 1986  . Carpal tunnel release Right 2014   History reviewed. No pertinent family history. History  Substance Use Topics  . Smoking status: Never Smoker   . Smokeless tobacco: Never Used  . Alcohol Use: Yes     Comment: occasionally   OB History    No data available     Review of Systems  Unable to perform ROS     Allergies  Review of patient's allergies indicates no known allergies.  Home Medications   Prior to Admission medications   Medication Sig Start Date End Date Taking? Authorizing Provider  aspirin 81 MG tablet Take 81 mg by mouth daily.   Yes Historical Provider, MD  atorvastatin (LIPITOR) 20 MG tablet Take 20 mg by mouth daily.   Yes Historical Provider, MD  beta carotene w/minerals (OCUVITE) tablet Take 1 tablet by mouth daily.   Yes Historical Provider, MD  calcium carbonate 200 MG capsule Take 250 mg by mouth 2 (two) times daily with a meal.   Yes Historical Provider, MD  cholecalciferol (VITAMIN D) 1000 UNITS tablet Take 500 Units by mouth daily.   Yes Historical Provider, MD  glucosamine-chondroitin 500-400 MG tablet Take 1 tablet by mouth 4 (four) times daily.   Yes  Historical Provider, MD  lisinopril (PRINIVIL,ZESTRIL) 20 MG tablet Take 20 mg by mouth daily.   Yes Historical Provider, MD  metoprolol succinate (TOPROL-XL) 25 MG 24 hr tablet Take 25 mg by mouth daily.  04/19/14  Yes Historical Provider, MD  pregabalin (LYRICA) 75 MG capsule Take 75 mg by mouth 2 (two) times daily.   Yes Historical Provider, MD  vitamin A 7500 UNIT capsule Take 7,500 Units by mouth daily.   Yes Historical Provider, MD  baclofen (LIORESAL) 10 MG tablet Take 1 tablet (10 mg total) by mouth 3 (three) times daily. As needed for muscle spasm 04/22/14   Marchia Bond, MD  HYDROcodone-acetaminophen [facility]) 10-325 MG per tablet Take 1-2 tablets by mouth every 6 (six) hours as needed. 04/22/14   Marchia Bond, MD  ondansetron (ZOFRAN) 4 MG tablet Take 1 tablet (4 mg total) by mouth every 8 (eight) hours as needed for nausea or vomiting. 04/22/14   Marchia Bond, MD  sennosides-docusate sodium (SENOKOT-S) 8.6-50 MG tablet Take 2 tablets by mouth daily. 04/22/14   Marchia Bond, MD   BP 119/57 mmHg  Pulse 89  Temp(Src) 97.1 F (36.2 C) (Oral)  Resp 17  Ht 5\' 8"  (1.727 m)  Wt 220 lb (99.791 kg)  BMI 33.46 kg/m2  SpO2 94%  LMP  (LMP Unknown) Physical Exam  Constitutional: She is oriented to person, place, and time. She appears well-developed and  well-nourished. No distress.  HENT:  Head: Normocephalic and atraumatic.  Mouth/Throat: Oropharynx is clear and moist.  Right-sided tongue deviation  Eyes: EOM are normal. Pupils are equal, round, and reactive to light.  Neck: Normal range of motion. Neck supple.  Cardiovascular: Normal rate and regular rhythm.  Exam reveals no gallop and no friction rub.   No murmur heard. Pulmonary/Chest: Effort normal and breath sounds normal. No respiratory distress. She has no wheezes. She has no rales. She exhibits no tenderness.  Abdominal: Soft. Bowel sounds are normal. She exhibits no distension and no mass. There is no tenderness. There is no rebound and  no guarding.  Musculoskeletal: Normal range of motion. She exhibits no edema or tenderness.  Left shoulder in sling. Distal pulses intact.  Neurological: She is alert and oriented to person, place, and time.  Dense paralysis of the left upper and lower extremity. Left upper and lower extremity numbness  Skin: Skin is warm and dry. No rash noted. No erythema.  Psychiatric: She has a normal mood and affect. Her behavior is normal.  Nursing note and vitals reviewed.   ED Course  Procedures (including critical care time) Labs Review Labs Reviewed  BASIC METABOLIC PANEL - Abnormal; Notable for the following:    Glucose, Bld 112 (*)    GFR calc non Af Amer 82 (*)    All other components within normal limits  POCT HEMOGLOBIN-HEMACUE - Abnormal; Notable for the following:    Hemoglobin 18.5 (*)    All other components within normal limits  I-STAT CHEM 8, ED - Abnormal; Notable for the following:    Glucose, Bld 125 (*)    Calcium, Ion 1.12 (*)    All other components within normal limits  SURGICAL PCR SCREEN  ETHANOL  PROTIME-INR  APTT  CBC  DIFFERENTIAL  COMPREHENSIVE METABOLIC PANEL  URINE RAPID DRUG SCREEN (HOSP PERFORMED)  URINALYSIS, ROUTINE W REFLEX MICROSCOPIC  I-STAT TROPOININ, ED  I-STAT TROPOININ, ED    Imaging Review Ct Head Wo Contrast  04/23/2014   CLINICAL DATA:  Code stroke.  Left-sided weakness.  Slurred speech.  EXAM: CT HEAD WITHOUT CONTRAST  TECHNIQUE: Contiguous axial images were obtained from the base of the skull through the vertex without intravenous contrast.  COMPARISON:  None.  FINDINGS: Diffuse cerebral atrophy. Minimal ventricular dilatation consistent with central atrophy. Low-attenuation changes in the deep white matter consistent with small vessel ischemia. No mass effect or midline shift. Gray-white matter junctions are distinct. There is focal encephalomalacia in the left frontal lobe consistent with old infarct. No evidence of acute intracranial  hemorrhage. There is increased density in the right middle cerebral artery which may indicate acute thrombus. This can be associated with acute changes of early infarct in the middle cerebral artery distribution. Calvarium appears intact. Vascular calcifications. Visualized paranasal sinuses and mastoid air cells are not opacified.  IMPRESSION: Dense right middle cerebral artery may indicate acute thrombus. No acute intracranial hemorrhage or mass effect. Chronic atrophy and small vessel ischemic changes. Old left frontal infarct.  These results were called by telephone at the time of interpretation on 04/23/2014 at 5:57 am to Dr. Julianne Rice , who verbally acknowledged these results.   Electronically Signed   By: Lucienne Capers M.D.   On: 04/23/2014 05:58   Nm Myocar Multi W/spect W/wall Motion / Ef  04/21/2014   CLINICAL DATA:  Chest pain  EXAM: Lexiscan Myovue  TECHNIQUE: The patient received IV Lexiscan .4mg  over 15 seconds. 33.0 mCi of  Technetium 66m Sestamibi injected at 30 seconds. Quantitative SPECT images were obtained in the vertical, horizontal and short axis planes after a 45 minute delay. Rest images were obtained with similar planes and delay using 10.2 mCi of Technetium 20m Sestamibi.  FINDINGS: ECG:  SR, no ST changes, Stress:  No change  Symptoms:  No symptoms.  RAW Data:  There is a lot extracardiac uptake on the rest images.  Quantitiative Gated SPECT EF:  LVEF 72%, dyskinetic septum.  Perfusion Images: There is decrease in the basal and mid inferolateral walls on stress images.  IMPRESSION: 1. This is a low risk study with small area, mild severity reversible defect in the basal and mid inferolateral walls that might be caused by an artifact secondary to extracardiac uptake on resting images.  Medical therapy is recommended.  2.  LVEF is 72% with septal dyskinesis.  3.  No ECG changes during Lexiscan infusion.  Ena Dawley   Electronically Signed   By: Ena Dawley   On: 04/21/2014  15:33   Dg Shoulder Left Port  04/22/2014   CLINICAL DATA:  Left shoulder replacement  EXAM: LEFT SHOULDER - 1 VIEW  COMPARISON:  None.  FINDINGS: Interval left shoulder arthroplasty without failure or complication. No dislocation. Mild degenerative changes of the acromioclavicular joint. Postsurgical changes in the surrounding soft tissues.  IMPRESSION: Left shoulder arthroplasty.   Electronically Signed   By: Kathreen Devoid   On: 04/22/2014 12:04     EKG Interpretation None      MDM   Final diagnoses:  CVA (cerebral vascular accident)    Neurology at bedside.  Discussed with neurology. Patient will need intra-arterial TPA as directed by IR. Currently our interventional radiologists is not available. Neurology recommends transfer the patient to [facility]. Dr. Janann Colonel hgas spoken to the radiologist Dr. Dellis Filbert who will set the patient in transfer    Julianne Rice, MD 04/23/14 [id number]

## 2014-04-23 NOTE — ED Notes (Signed)
[facility] has left with the pt to transport her to [facility]

## 2014-04-23 NOTE — ED Notes (Signed)
Pt was at the [facility] surgery center. A nurse was ambulating her to the bathroom, and on the way back the pt started having left sided facial droop, slurred speech and drooling.

## 2014-04-23 NOTE — Consult Note (Signed)
Stroke Consult    Chief Complaint: left sided weakness, slurred speech HPI: Peggy [name] is an 80 y.o. female hx of HTN, mitral valve prolapse s/p left total shoulder arthroplasty on 4/08. LSW at 0445 this morning, shortly after noted to have acute left sided weakness, slurred speech and question of gaze deviation to the right. Code stroke activated. BP 126/73 upon arrival. CT head completed, imaging reviewed, shows dense right MCA which may indicate a thrombus.  Initial NIHSS of 12 (though likely higher as unable to formally assess LUE)  Date last known well: 04/23/2014 Time last known well: 0445 tPA Given: no IV tPA due to recent surgery. Transfer to [facility] for IR evaluation  Past Medical History  Diagnosis Date  . Hypertension   . Arthritis   . Mitral valve prolapse     takes antiabiotic with procedures  . Osteoarthritis of left shoulder, primary localized 04/22/2014    Past Surgical History  Procedure Laterality Date  . Joint replacement      bilat knees  . Back surgery      L5 removed 1986  . Carpal tunnel release Right 2014    History reviewed. No pertinent family history. Social History:  reports that she has never smoked. She has never used smokeless tobacco. She reports that she drinks alcohol. She reports that she does not use illicit drugs.  Allergies: No Known Allergies   (Not in a hospital admission)  ROS: Out of a complete 14 system review, the patient complains of only the following symptoms, and all other reviewed systems are negative. Unable to assess  Physical Examination: Filed Vitals:   04/23/14 0515  BP: 110/73  Pulse: 89  Temp:   Resp:    Physical Exam  Constitutional: He appears well-developed and well-nourished.  Psych: Affect appropriate to situation Eyes: No scleral injection HENT: No OP obstrucion Head: Normocephalic.  Cardiovascular: Normal rate and regular rhythm.  Respiratory: Effort normal and breath sounds normal.  GI:  Soft. Bowel sounds are normal. No distension. There is no tenderness.  Skin: WDI   Neurologic Examination: Mental Status: Lethargic, arousable to voice and light noxious stimuli. Neglects left side. Follows commands on right side Cranial Nerves: II: optic discs not visualized,left VF cut, pupils equal, round, reactive to light III,IV, VI: ptosis not present, extra-ocular motions intact bilaterally V,VII: left facial droop VIII: hearing normal bilaterally IX,X: gag reflex present XI: trapezius strength/neck flexion strength normal bilaterally XII: tongue strength normal  Motor: Moving  Unable to test LUE, flaccid LLE Moves RUE and RLE against gravity and light resistance Tone and bulk:normal tone throughout; no atrophy noted Sensory: neglects left side Deep Tendon Reflexes: 2+ and symmetric throughout Plantars: Right: downgoing   Left: downgoing Cerebellar: Unable to test Gait: unable to test  Laboratory Studies:   Basic Metabolic Panel: No results for input(s): NA, K, CL, CO2, GLUCOSE, BUN, CREATININE, CALCIUM, MG, PHOS in the last 168 hours.  Liver Function Tests: No results for input(s): AST, ALT, ALKPHOS, BILITOT, PROT, ALBUMIN in the last 168 hours. No results for input(s): LIPASE, AMYLASE in the last 168 hours. No results for input(s): AMMONIA in the last 168 hours.  CBC:  Recent Labs Lab 04/22/14 0730  HGB 18.5*    Cardiac Enzymes: No results for input(s): CKTOTAL, CKMB, CKMBINDEX, TROPONINI in the last 168 hours.  BNP: Invalid input(s): POCBNP  CBG: No results for input(s): GLUCAP in the last 168 hours.  Microbiology: Results for orders placed or performed during the hospital encounter  of 04/22/14  Surgical pcr screen     Status: None   Collection Time: 04/12/14 10:13 AM  Result Value Ref Range Status   MRSA, PCR NEGATIVE NEGATIVE Final   Staphylococcus aureus NEGATIVE NEGATIVE Final    Comment:        The Xpert SA Assay (FDA approved for NASAL  specimens in patients over 20 years of age), is one component of a comprehensive surveillance program.  Test performance has been validated by [facility] for patients greater than or equal to 63 year old. It is not intended to diagnose infection nor to guide or monitor treatment.     Coagulation Studies: No results for input(s): LABPROT, INR in the last 72 hours.  Urinalysis: No results for input(s): COLORURINE, LABSPEC, PHURINE, GLUCOSEU, HGBUR, BILIRUBINUR, KETONESUR, PROTEINUR, UROBILINOGEN, NITRITE, LEUKOCYTESUR in the last 168 hours.  Invalid input(s): APPERANCEUR  Lipid Panel:  No results found for: CHOL, TRIG, HDL, CHOLHDL, VLDL, LDLCALC  HgbA1C: No results found for: HGBA1C  Urine Drug Screen:  No results found for: LABOPIA, COCAINSCRNUR, LABBENZ, AMPHETMU, THCU, LABBARB  Alcohol Level: No results for input(s): ETH in the last 168 hours.  Other results:  Imaging: Ct Head Wo Contrast  04/23/2014   CLINICAL DATA:  Code stroke.  Left-sided weakness.  Slurred speech.  EXAM: CT HEAD WITHOUT CONTRAST  TECHNIQUE: Contiguous axial images were obtained from the base of the skull through the vertex without intravenous contrast.  COMPARISON:  None.  FINDINGS: Diffuse cerebral atrophy. Minimal ventricular dilatation consistent with central atrophy. Low-attenuation changes in the deep white matter consistent with small vessel ischemia. No mass effect or midline shift. Gray-white matter junctions are distinct. There is focal encephalomalacia in the left frontal lobe consistent with old infarct. No evidence of acute intracranial hemorrhage. There is increased density in the right middle cerebral artery which may indicate acute thrombus. This can be associated with acute changes of early infarct in the middle cerebral artery distribution. Calvarium appears intact. Vascular calcifications. Visualized paranasal sinuses and mastoid air cells are not opacified.  IMPRESSION: Dense right middle  cerebral artery may indicate acute thrombus. No acute intracranial hemorrhage or mass effect. Chronic atrophy and small vessel ischemic changes. Old left frontal infarct.  These results were called by telephone at the time of interpretation on 04/23/2014 at 5:57 am to Dr. Julianne Rice , who verbally acknowledged these results.   Electronically Signed   By: Lucienne Capers M.D.   On: 04/23/2014 05:58   Nm Myocar Multi W/spect W/wall Motion / Ef  04/21/2014   CLINICAL DATA:  Chest pain  EXAM: Lexiscan Myovue  TECHNIQUE: The patient received IV Lexiscan .4mg  over 15 seconds. 33.0 mCi of Technetium 36m Sestamibi injected at 30 seconds. Quantitative SPECT images were obtained in the vertical, horizontal and short axis planes after a 45 minute delay. Rest images were obtained with similar planes and delay using 10.2 mCi of Technetium 75m Sestamibi.  FINDINGS: ECG:  SR, no ST changes, Stress:  No change  Symptoms:  No symptoms.  RAW Data:  There is a lot extracardiac uptake on the rest images.  Quantitiative Gated SPECT EF:  LVEF 72%, dyskinetic septum.  Perfusion Images: There is decrease in the basal and mid inferolateral walls on stress images.  IMPRESSION: 1. This is a low risk study with small area, mild severity reversible defect in the basal and mid inferolateral walls that might be caused by an artifact secondary to extracardiac uptake on resting images.  Medical therapy  is recommended.  2.  LVEF is 72% with septal dyskinesis.  3.  No ECG changes during Lexiscan infusion.  Ena Dawley   Electronically Signed   By: Ena Dawley   On: 04/21/2014 15:33   Dg Shoulder Left Port  04/22/2014   CLINICAL DATA:  Left shoulder replacement  EXAM: LEFT SHOULDER - 1 VIEW  COMPARISON:  None.  FINDINGS: Interval left shoulder arthroplasty without failure or complication. No dislocation. Mild degenerative changes of the acromioclavicular joint. Postsurgical changes in the surrounding soft tissues.  IMPRESSION: Left  shoulder arthroplasty.   Electronically Signed   By: Kathreen Devoid   On: 04/22/2014 12:04    Assessment: 80 y.o. female hx of HTN s/p left total shoulder arthroplasty on 4/08 presenting with acute onset of left sided weakness, slurred speech and right sided neglect. Concerning for R MCA infarct. CT head shows dense right MCA sign. Unable to get IV tPA. [facility] unable due to involvement in another case. Called and discussed case with Peggy [name] who will accept patient for transfer and possible IR intervention. Will notify patients orthopaedic doctor of transfer.   This patient is critically ill and at significant risk of neurological worsening, death and care requires constant monitoring of vital signs, hemodynamics,respiratory and cardiac monitoring,review of multiple databases, neurological assessment, discussion with family, other specialists and medical decision making of high complexity. I spent 35 inutes of neurocritical care time in the care of this patient.    Jim Like, DO [facility] [phone]  If 7pm- 7am, please page neurology on call as listed in AMION. 04/23/2014, 6:05 AM

## 2014-04-25 ENCOUNTER — Encounter (HOSPITAL_BASED_OUTPATIENT_CLINIC_OR_DEPARTMENT_OTHER): Payer: Self-pay | Admitting: Orthopedic Surgery

## 2014-04-26 MED ORDER — GLUCAGON HCL RDNA (DIAGNOSTIC) 1 MG IJ SOLR
INTRAMUSCULAR | Status: AC
  Filled 2014-04-26: qty 1

## 2014-04-26 MED ORDER — CEFAZOLIN SODIUM-DEXTROSE 2-3 GM-% IV SOLR
INTRAVENOUS | Status: AC
  Filled 2014-04-26: qty 50

## 2014-04-26 MED ORDER — FENTANYL CITRATE 0.05 MG/ML IJ SOLN
INTRAMUSCULAR | Status: AC
  Filled 2014-04-26: qty 2

## 2014-04-27 ENCOUNTER — Encounter: Payer: Self-pay | Admitting: *Deleted

## 2014-04-27 NOTE — PMR Pre-admission (Shared)
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 female who presented to the [facility] surgery center on 04-22-14 for a right total shoulder arthroplasty. After the surgery, a nurse was ambulating her to the bathroom and on the way back the pt started having left sided facial droop, slurred speech and drooling with arm/leg weakness as well. She was then transferred to [facility] ED. Work up revealed acute right MCA and right basal ganglia CVA with left sided hemiparesis. Pt was not an IV TPA candidate because of recent shoulder surgery. NIH stroke scale was approximately 12-16.   Per epic, "discussed with neurology. Patient will need intra-arterial TPA as directed by IR. Currently our interventional radiologists is not available. Neurology recommends transfer the patient to [facility]. Dr. Janann Colonel hgas spoken to the radiologist Dr. Dellis Filbert who will set the patient in transfer."  Pt was  transferred to [facility] on 04-23-14 and had right MCA thrombectomy with right carotid angioplasty x 2/stenting on 04-23-14. NIH stroke scale at this time 8. CT head following procedure was negative for hemorrhage. Physical, Occupational and Speech services were consulted and inpatient rehab was recommended. Modified barium swallow was completed on 04-27-14 and pt is now on a                   Diet with            Liquids. Pt has been progressing well with therapies and she and her husband are motivated to get closer to home for her further rehab needs.  Patient's medical record from [facility] has been reviewed by the rehabilitation admission coordinator and physician.   Past Medical History  Past Medical History  Diagnosis Date  . Hypertension   . Arthritis   . Mitral valve prolapse     takes antiabiotic with procedures  . Osteoarthritis of left shoulder, primary localized 04/22/2014    Family History  family history is not on file.  Prior Rehab/Hospitalizations: pt had recent shoulder sx at [place] on 04-22-14.    Current Medications See MAR  Patients Current Diet:  ***  Precautions / Restrictions Precautions Precautions: Fall, Shoulder (sling L UE, NWB L UE, see below) Type of Shoulder Precautions:  AROM to pivots below shoulder, minimal circumduction for self care to arm pit, sling on when OOB, no AROM to L shoulder (per PA at [facility], documented in OT note from [place]) Restrictions Weight Bearing Restrictions: Yes LUE Weight Bearing: Non weight bearing (no pushing, no pulling and no lifting with L UE)   Prior Activity Level Limited Community (1-2x/wk): pt got out with her husband (she does not like to drive).   Home Assistive Devices / Equipment Home Assistive Devices/Equipment: Cane (specify quad or straight), Walker (specify type), Wheelchair (rolling walker and manual WC)   Prior Functional Level Current Functional Level  Bed  Mobility  Independent  Mod assist   Transfers  Independent  Mod assist   Mobility - Walk/Wheelchair  Independent  Mod assist   Upper Body Dressing  Independent  Max assist   Lower Body Dressing  Independent  Max assist   Grooming  Independent      Eating/Drinking  Independent   (supervision/set up )   Toilet Transfer  Independent  Other (not assessed, anticipate needs)   Bladder Continence   [facility]      Bowel Management  WFL      Stair Climbing   Independent  Other (not assessed, anticipate needs)   Communication  WFL  slurred speech   Memory  WFL  unable to assess   Cooking/Meal Prep  Independent      Housework  Independent    Money Management  Independent     Driving   Pt preferred not to drive and husband did all the driving.     Special needs/care consideration BiPAP/CPAP*** CPM no  Continuous Drip IV*** Dialysis no         Life Vest no Oxygen*** Special Bed no  Trach Size no  Wound Vac (area) no       Skin***                              Location*** Bowel mgmt:*** Bladder mgmt:*** Diabetic mgmt***  Previous Home Environment Living Arrangements: Spouse/significant other  Lives With: Spouse Available Help at Discharge: Family Type of Home: House Home Layout: One level Bathroom Shower/Tub: Multimedia programmer: Standard  Discharge Living Setting Plans for Discharge Living Setting: Patient's home Type of Home at Discharge: House Discharge Home Layout: One level Discharge Bathroom Shower/Tub: Walk-in shower Discharge Bathroom Toilet: Standard Does the patient have any problems obtaining your medications?: No  Social/Family/Support Systems Patient Roles: Spouse Contact Information: husband is primary contact Anticipated Caregiver: husband Anticipated Ambulance person Information: see above  Goals/Additional Needs Patient/Family Goal for Rehab: Supervision with PT/OT/SLP Expected length of stay: 11-14  days Equipment Needs: to be determined  Patient Condition: We received this referral on 04-27-14 for this 80 year old patient who was previously independent prior to her recent right total shoulder arthroplasty on 04-22-14 and subsequent right MCA stroke. Pt's shoulder surgery was performed at [place] [facility] and she was transferred to [facility] on 04-23-14 for thrombectomy/stenting procedure. We have been following pt's case since 04-27-14 and pt and her husband are motivated to return to [place] to be closer to home for her further rehabilitation needs. She was previously independent with her mobility and with her activities of daily living. Ms.Sabatino is currently needing                     Assistance with              (  mobility) and                    Assistance with self care skills. In addition, pt's speech is dysarthric and she is currently on a                      Diet. She will benefit from further skilled speech services to progress her swallowing needs/diet and to address cognitive issues associated with MCA stroke. She will also benefit greatly from the multi-disciplinary team of skilled PT, OT, SLP and rehab nursing to maximize her functional return following her recent shoulder arthroplasty and right MCA CVA. PT, OT and rehab nursing will focus on increasing strength for greater independence with bed mobility, transfers, gait and self care skills. Rehab nursing will focus on pt/family education with medications, CVA education and bladder/bowel needs. In addition, pt will benefit from rehab physician intervention to monitor medical status following new CVA and shoulder arthroplasty. Discussed case with Dr. Naaman Plummer and rehab PA and pt was determined to be a good candidate for our inpatient rehab program. We received medical clearance and pt/husband are motivated to come to inpatient rehab closer to home. She will benefit from the intensive services of skilled therapy  under rehab physician guidance. Pt will be admitted today on              .                                  .   Preadmission Screen Completed By:  Ave Filter, 04/27/2014 3:04 PM ______________________________________________________________________   Discussed status with Dr. Marland Kitchen on *** at *** and received telephone approval for admission today.  Admission Coordinator:  Dillan Lunden L, time ***/Date ***   Assessment/Plan: Diagnosis: 1. Does the need for close, 24 hr/day  Medical supervision in concert with the patient's rehab needs make it unreasonable for this patient to be served in a less intensive setting? {yes_no_potentially:3041433} 2. Co-Morbidities requiring supervision/potential complications: *** 3. Due to {due IW:5809983}, does the patient require 24 hr/day rehab nursing? {yes_no_potentially:3041433} 4. Does the patient require coordinated care of a physician, rehab nurse, {coordinated JASN:0539767} to address physical and functional deficits in the context of the above medical diagnosis(es)? {yes_no_potentially:3041433} Addressing deficits in the following areas: {deficits:3041436} 5. Can the patient actively participate in an intensive therapy program of at least 3 hrs of therapy 5 days a week? {yes_no_potentially:3041433} 6. The potential for patient to make measurable gains while on inpatient rehab is {potential:3041437} 7. Anticipated functional outcomes upon discharge from inpatients are: {functional outcomes:304600100} PT, {functional outcomes:304600100} OT, {functional outcomes:304600100} SLP 8. Estimated rehab length of stay to reach the above functional goals is: *** 9. Does the patient have adequate social supports to accommodate these discharge functional goals? {yes_no_potentially:3041433} 10. Anticipated D/C setting: {anticipated dc setting:21604} 11. Anticipated post D/C treatments: {post dc treatment:21605} 12. Overall Rehab/Functional Prognosis:  {potential:3041437}    RECOMMENDATIONS: This patient's condition is appropriate for continued rehabilitative care in the following setting: {appropriate setting:21606} Patient has agreed to participate in recommended program. {yes_no_potentially:3041433} Note that insurance prior authorization may be required for reimbursement for recommended care.  Comment:  Khayri Kargbo L 04/27/2014

## 2014-05-04 DIAGNOSIS — I69354 Hemiplegia and hemiparesis following cerebral infarction affecting left non-dominant side: Secondary | ICD-10-CM | POA: Diagnosis not present

## 2014-05-04 DIAGNOSIS — Z4789 Encounter for other orthopedic aftercare: Secondary | ICD-10-CM | POA: Diagnosis not present

## 2014-05-04 DIAGNOSIS — I10 Essential (primary) hypertension: Secondary | ICD-10-CM | POA: Diagnosis not present

## 2014-05-04 DIAGNOSIS — I69391 Dysphagia following cerebral infarction: Secondary | ICD-10-CM | POA: Diagnosis not present

## 2014-05-04 DIAGNOSIS — Z96612 Presence of left artificial shoulder joint: Secondary | ICD-10-CM | POA: Diagnosis not present

## 2014-05-04 DIAGNOSIS — R1312 Dysphagia, oropharyngeal phase: Secondary | ICD-10-CM | POA: Diagnosis not present

## 2014-05-04 DIAGNOSIS — I69322 Dysarthria following cerebral infarction: Secondary | ICD-10-CM | POA: Diagnosis not present

## 2014-05-06 DIAGNOSIS — E785 Hyperlipidemia, unspecified: Secondary | ICD-10-CM | POA: Diagnosis not present

## 2014-05-06 DIAGNOSIS — Z959 Presence of cardiac and vascular implant and graft, unspecified: Secondary | ICD-10-CM | POA: Diagnosis not present

## 2014-05-06 DIAGNOSIS — I63511 Cerebral infarction due to unspecified occlusion or stenosis of right middle cerebral artery: Secondary | ICD-10-CM | POA: Diagnosis not present

## 2014-05-06 DIAGNOSIS — I10 Essential (primary) hypertension: Secondary | ICD-10-CM | POA: Diagnosis not present

## 2014-05-12 DIAGNOSIS — I69391 Dysphagia following cerebral infarction: Secondary | ICD-10-CM | POA: Diagnosis not present

## 2014-05-12 DIAGNOSIS — Z96612 Presence of left artificial shoulder joint: Secondary | ICD-10-CM | POA: Diagnosis not present

## 2014-05-12 DIAGNOSIS — I69354 Hemiplegia and hemiparesis following cerebral infarction affecting left non-dominant side: Secondary | ICD-10-CM | POA: Diagnosis not present

## 2014-05-12 DIAGNOSIS — I69322 Dysarthria following cerebral infarction: Secondary | ICD-10-CM | POA: Diagnosis not present

## 2014-05-12 DIAGNOSIS — I10 Essential (primary) hypertension: Secondary | ICD-10-CM | POA: Diagnosis not present

## 2014-05-12 DIAGNOSIS — R1312 Dysphagia, oropharyngeal phase: Secondary | ICD-10-CM | POA: Diagnosis not present

## 2014-05-19 DIAGNOSIS — R1312 Dysphagia, oropharyngeal phase: Secondary | ICD-10-CM | POA: Diagnosis not present

## 2014-05-19 DIAGNOSIS — I10 Essential (primary) hypertension: Secondary | ICD-10-CM | POA: Diagnosis not present

## 2014-05-19 DIAGNOSIS — I69391 Dysphagia following cerebral infarction: Secondary | ICD-10-CM | POA: Diagnosis not present

## 2014-05-19 DIAGNOSIS — I69354 Hemiplegia and hemiparesis following cerebral infarction affecting left non-dominant side: Secondary | ICD-10-CM | POA: Diagnosis not present

## 2014-05-19 DIAGNOSIS — I69322 Dysarthria following cerebral infarction: Secondary | ICD-10-CM | POA: Diagnosis not present

## 2014-05-19 DIAGNOSIS — Z96612 Presence of left artificial shoulder joint: Secondary | ICD-10-CM | POA: Diagnosis not present

## 2014-05-31 DIAGNOSIS — R011 Cardiac murmur, unspecified: Secondary | ICD-10-CM | POA: Diagnosis not present

## 2014-06-01 DIAGNOSIS — M19012 Primary osteoarthritis, left shoulder: Secondary | ICD-10-CM | POA: Diagnosis not present

## 2014-06-08 DIAGNOSIS — M25512 Pain in left shoulder: Secondary | ICD-10-CM | POA: Diagnosis not present

## 2014-06-08 DIAGNOSIS — M19012 Primary osteoarthritis, left shoulder: Secondary | ICD-10-CM | POA: Diagnosis not present

## 2014-06-08 DIAGNOSIS — M25612 Stiffness of left shoulder, not elsewhere classified: Secondary | ICD-10-CM | POA: Diagnosis not present

## 2014-06-08 DIAGNOSIS — R531 Weakness: Secondary | ICD-10-CM | POA: Diagnosis not present

## 2014-06-09 ENCOUNTER — Encounter: Payer: Self-pay | Admitting: Neurology

## 2014-06-09 ENCOUNTER — Ambulatory Visit (INDEPENDENT_AMBULATORY_CARE_PROVIDER_SITE_OTHER): Payer: Medicare Other | Admitting: Neurology

## 2014-06-09 VITALS — BP 128/70 | HR 76 | Resp 16 | Ht 67.5 in | Wt 205.2 lb

## 2014-06-09 DIAGNOSIS — L723 Sebaceous cyst: Secondary | ICD-10-CM | POA: Diagnosis not present

## 2014-06-09 DIAGNOSIS — L821 Other seborrheic keratosis: Secondary | ICD-10-CM | POA: Diagnosis not present

## 2014-06-09 DIAGNOSIS — I639 Cerebral infarction, unspecified: Secondary | ICD-10-CM

## 2014-06-09 DIAGNOSIS — I6521 Occlusion and stenosis of right carotid artery: Secondary | ICD-10-CM | POA: Diagnosis not present

## 2014-06-09 DIAGNOSIS — I63311 Cerebral infarction due to thrombosis of right middle cerebral artery: Secondary | ICD-10-CM

## 2014-06-09 DIAGNOSIS — L8992 Pressure ulcer of unspecified site, stage 2: Secondary | ICD-10-CM | POA: Diagnosis not present

## 2014-06-09 NOTE — Progress Notes (Addendum)
NEUROLOGY CONSULTATION NOTE  Peggy [name] MRN: [id number] DOB: September 15, 1934  Referring provider: Thomas Hoff, FNP Primary care provider: Dr. Aura Dials  Reason for consult:  Stroke  HISTORY OF PRESENT ILLNESS: Peggy [name] is a 80 year old right-handed woman with hypertension, arthritis and mitral valve prolapse who presents for recent stroke.  Records, Echo report, CT of head, report of MRI of brain and CTA of head and neck, and labs reviewed.  She is accompanied by her husband who provide some history.  On 04/22/14, patient underwent left shoulder replacement.  The next day, she collapsed.  She had left facial weakness, as well as left arm and leg weakness.  She was not a tPA candidate due to the recent surgery.  She was transferred to [facility].  CTA of the head and neck showed right MCA occlusion with reconstitution of flow within its branches.  As well as severe proximal right ICA stenosis.  MRI of brain confirmed acute small right MCA territory infarcts.  Echo showed EF of 65-70% with no cardiac source of embolus.  LDL was 63, triglycerides 113 and Hgb A1c 5.4.  She underwent right MCA thrombectomy and right carotid angioplasty and stenting.  During her hospitalization, she developed  hypotension and was started on Florinef.  Plavix was added to ASA 81mg  daily.  She is taking Lipitor 40mg .  PAST MEDICAL HISTORY: Past Medical History  Diagnosis Date  . Hypertension   . Arthritis   . Mitral valve prolapse     takes antiabiotic with procedures  . Osteoarthritis of left shoulder, primary localized 04/22/2014    PAST SURGICAL HISTORY: Past Surgical History  Procedure Laterality Date  . Joint replacement      bilat knees  . Back surgery      L5 removed 1986  . Carpal tunnel release Right 2014  . Total shoulder arthroplasty Left 04/22/2014    Procedure: LEFT TOTAL SHOULDER ARTHROPLASTY;  Surgeon: Marchia Bond, MD;  Location: [facility];   Service: Orthopedics;  Laterality: Left;    MEDICATIONS: Current Outpatient Prescriptions on File Prior to Visit  Medication Sig Dispense Refill  . aspirin 81 MG tablet Take 81 mg by mouth daily.    Marland Kitchen atorvastatin (LIPITOR) 20 MG tablet Take 20 mg by mouth daily.    . beta carotene w/minerals (OCUVITE) tablet Take 1 tablet by mouth daily.    . calcium carbonate 200 MG capsule Take 250 mg by mouth 2 (two) times daily with a meal.    . cholecalciferol (VITAMIN D) 1000 UNITS tablet Take 500 Units by mouth daily.    Marland Kitchen glucosamine-chondroitin 500-400 MG tablet Take 1 tablet by mouth 4 (four) times daily.    Marland Kitchen lisinopril (PRINIVIL,ZESTRIL) 20 MG tablet Take 20 mg by mouth daily.    . metoprolol succinate (TOPROL-XL) 25 MG 24 hr tablet Take 25 mg by mouth daily.     . pregabalin (LYRICA) 75 MG capsule Take 75 mg by mouth 2 (two) times daily.    . baclofen (LIORESAL) 10 MG tablet Take 1 tablet (10 mg total) by mouth 3 (three) times daily. As needed for muscle spasm (Patient not taking: Reported on 06/09/2014) 50 tablet 0  . HYDROcodone-acetaminophen (NORCO) 10-325 MG per tablet Take 1-2 tablets by mouth every 6 (six) hours as needed. (Patient not taking: Reported on 06/09/2014) 75 tablet 0  . ondansetron (ZOFRAN) 4 MG tablet Take 1 tablet (4 mg total) by mouth every 8 (eight) hours as needed for  nausea or vomiting. (Patient not taking: Reported on 06/09/2014) 30 tablet 0  . sennosides-docusate sodium (SENOKOT-S) 8.6-50 MG tablet Take 2 tablets by mouth daily. (Patient not taking: Reported on 06/09/2014) 30 tablet 1  . vitamin A 7500 UNIT capsule Take 7,500 Units by mouth daily.     No current facility-administered medications on file prior to visit.    ALLERGIES: No Known Allergies  FAMILY HISTORY: Family History  Problem Relation Age of Onset  . Diabetes Mother   . Heart failure Father   . Cancer Mother   . Stroke Maternal Grandmother   . Cancer Maternal Grandfather     unknown  . Stroke  Paternal Grandmother     SOCIAL HISTORY: History   Social History  . Marital Status: Married    Spouse Name: N/A  . Number of Children: N/A  . Years of Education: N/A   Occupational History  . Not on file.   Social History Main Topics  . Smoking status: Never Smoker   . Smokeless tobacco: Never Used  . Alcohol Use: 0.0 oz/week    0 Standard drinks or equivalent per week     Comment: occasionally  . Drug Use: No  . Sexual Activity:    Partners: Male   Other Topics Concern  . Not on file   Social History Narrative    REVIEW OF SYSTEMS: Constitutional: No fevers, chills, or sweats, no generalized fatigue, change in appetite Eyes: No visual changes, double vision, eye pain Ear, nose and throat: No hearing loss, ear pain, nasal congestion, sore throat Cardiovascular: No chest pain, palpitations Respiratory:  No shortness of breath at rest or with exertion, wheezes GastrointestinaI: No nausea, vomiting, diarrhea, abdominal pain, fecal incontinence Genitourinary:  No dysuria, urinary retention or frequency Musculoskeletal:  No neck pain, back pain Integumentary: No rash, pruritus, skin lesions Neurological: as above Psychiatric: No depression, insomnia, anxiety Endocrine: No palpitations, fatigue, diaphoresis, mood swings, change in appetite, change in weight, increased thirst Hematologic/Lymphatic:  No anemia, purpura, petechiae. Allergic/Immunologic: no itchy/runny eyes, nasal congestion, recent allergic reactions, rashes  PHYSICAL EXAM: Filed Vitals:   06/09/14 1026  BP: 128/70  Pulse: 76  Resp: 16   General: No acute distress Head:  Normocephalic/atraumatic Eyes:  fundi unremarkable, without vessel changes, exudates, hemorrhages or papilledema. Neck: supple, no paraspinal tenderness, full range of motion Back: No paraspinal tenderness Heart: regular rate and rhythm Lungs: Clear to auscultation bilaterally. Vascular: No carotid bruits. Neurological  Exam: Mental status: alert and oriented to person, place, and time, recent and remote memory intact, fund of knowledge intact, attention and concentration intact, speech fluent but mildly dysarthric, language intact. Cranial nerves: CN I: not tested CN II: pupils equal, round and reactive to light, visual fields intact, fundi unremarkable, without vessel changes, exudates, hemorrhages or papilledema. CN III, IV, VI:  full range of motion, saccadic eye movements, no nystagmus, no ptosis CN V: facial sensation intact CN VII: left lower facial weakness CN VIII: hearing intact CN IX, X: gag intact, uvula midline CN XI: sternocleidomastoid and trapezius muscles intact CN XII: tongue midline Bulk & Tone: normal, no fasciculations. Motor:  Unable to test proximal arms due to recent left shoulder surgery and arthritis in right shoulder.  Otherwise, 5/5 throughout except for maybe trace weakness in left grip Sensation:  Pinprick and vibration intact Deep Tendon Reflexes:  2+ throughout, toes downgoing Finger to nose testing:  No dysmetria Heel to shin:  No dysmetria Gait:  Wide-based mildly antalgic gait.  Unable  to tandem walk. Romberg negative.  IMPRESSION: Right MCA stroke, possibly thrombo-embolic from right carotid artery. Right carotid stenosis status post stent  PLAN: 1.  Continue ASA 81mg  daily and Plavix 75mg  daily.  On 07/23/14 (three months after stroke and stenting), may discontinue ASA and remain on Plavix 2.  Continue Lipitor (LDL at goal of less than 100) 3.  Mediterranean diet 4.  Follow up in 3 months.  Thank you for allowing me to take part in the care of this patient.  Metta Clines, DO  CC:  Aura Dials, MD  Caryl Comes, MD  Thomas Hoff, FNP

## 2014-06-09 NOTE — Patient Instructions (Signed)
1.  Continue aspirin 81mg  daily and Plavix 75mg  daily.  You can discontinue the aspirin on 07/23/14 and just remain on the Plavix 2.  Mediterranean diet 3.  Follow up in 3 months.

## 2014-06-10 DIAGNOSIS — R9431 Abnormal electrocardiogram [ECG] [EKG]: Secondary | ICD-10-CM | POA: Diagnosis not present

## 2014-06-10 DIAGNOSIS — I10 Essential (primary) hypertension: Secondary | ICD-10-CM | POA: Diagnosis not present

## 2014-06-10 DIAGNOSIS — I350 Nonrheumatic aortic (valve) stenosis: Secondary | ICD-10-CM | POA: Diagnosis not present

## 2014-06-10 DIAGNOSIS — Z8673 Personal history of transient ischemic attack (TIA), and cerebral infarction without residual deficits: Secondary | ICD-10-CM | POA: Diagnosis not present

## 2014-06-14 DIAGNOSIS — Z96612 Presence of left artificial shoulder joint: Secondary | ICD-10-CM | POA: Diagnosis not present

## 2014-06-14 DIAGNOSIS — G609 Hereditary and idiopathic neuropathy, unspecified: Secondary | ICD-10-CM | POA: Diagnosis not present

## 2014-06-14 DIAGNOSIS — I679 Cerebrovascular disease, unspecified: Secondary | ICD-10-CM | POA: Diagnosis not present

## 2014-06-14 DIAGNOSIS — I10 Essential (primary) hypertension: Secondary | ICD-10-CM | POA: Diagnosis not present

## 2014-06-14 DIAGNOSIS — E785 Hyperlipidemia, unspecified: Secondary | ICD-10-CM | POA: Diagnosis not present

## 2014-06-15 DIAGNOSIS — M25512 Pain in left shoulder: Secondary | ICD-10-CM | POA: Diagnosis not present

## 2014-06-15 DIAGNOSIS — R531 Weakness: Secondary | ICD-10-CM | POA: Diagnosis not present

## 2014-06-15 DIAGNOSIS — M19012 Primary osteoarthritis, left shoulder: Secondary | ICD-10-CM | POA: Diagnosis not present

## 2014-06-15 DIAGNOSIS — M25612 Stiffness of left shoulder, not elsewhere classified: Secondary | ICD-10-CM | POA: Diagnosis not present

## 2014-06-17 DIAGNOSIS — M19012 Primary osteoarthritis, left shoulder: Secondary | ICD-10-CM | POA: Diagnosis not present

## 2014-06-17 DIAGNOSIS — M25612 Stiffness of left shoulder, not elsewhere classified: Secondary | ICD-10-CM | POA: Diagnosis not present

## 2014-06-17 DIAGNOSIS — R531 Weakness: Secondary | ICD-10-CM | POA: Diagnosis not present

## 2014-06-17 DIAGNOSIS — M25512 Pain in left shoulder: Secondary | ICD-10-CM | POA: Diagnosis not present

## 2014-06-22 DIAGNOSIS — M25612 Stiffness of left shoulder, not elsewhere classified: Secondary | ICD-10-CM | POA: Diagnosis not present

## 2014-06-22 DIAGNOSIS — M25512 Pain in left shoulder: Secondary | ICD-10-CM | POA: Diagnosis not present

## 2014-06-22 DIAGNOSIS — R531 Weakness: Secondary | ICD-10-CM | POA: Diagnosis not present

## 2014-06-24 DIAGNOSIS — R531 Weakness: Secondary | ICD-10-CM | POA: Diagnosis not present

## 2014-06-24 DIAGNOSIS — M25612 Stiffness of left shoulder, not elsewhere classified: Secondary | ICD-10-CM | POA: Diagnosis not present

## 2014-06-24 DIAGNOSIS — M19012 Primary osteoarthritis, left shoulder: Secondary | ICD-10-CM | POA: Diagnosis not present

## 2014-06-24 DIAGNOSIS — M25512 Pain in left shoulder: Secondary | ICD-10-CM | POA: Diagnosis not present

## 2014-06-29 ENCOUNTER — Other Ambulatory Visit: Payer: Self-pay | Admitting: Orthopedic Surgery

## 2014-06-29 DIAGNOSIS — M25511 Pain in right shoulder: Secondary | ICD-10-CM

## 2014-06-29 DIAGNOSIS — Z96612 Presence of left artificial shoulder joint: Secondary | ICD-10-CM | POA: Diagnosis not present

## 2014-06-29 DIAGNOSIS — M19011 Primary osteoarthritis, right shoulder: Secondary | ICD-10-CM | POA: Diagnosis not present

## 2014-07-05 ENCOUNTER — Other Ambulatory Visit: Payer: Medicare Other

## 2014-07-06 ENCOUNTER — Ambulatory Visit
Admission: RE | Admit: 2014-07-06 | Discharge: 2014-07-06 | Disposition: A | Discharge status: Home / Self Care | Payer: Medicare Other | Source: Ambulatory Visit | Attending: Orthopedic Surgery | Admitting: Orthopedic Surgery

## 2014-07-06 ENCOUNTER — Encounter: Payer: Self-pay | Admitting: Neurology

## 2014-07-06 ENCOUNTER — Ambulatory Visit (INDEPENDENT_AMBULATORY_CARE_PROVIDER_SITE_OTHER): Payer: Medicare Other | Admitting: Neurology

## 2014-07-06 VITALS — BP 116/70 | HR 89 | Ht 68.0 in | Wt 210.0 lb

## 2014-07-06 DIAGNOSIS — I639 Cerebral infarction, unspecified: Secondary | ICD-10-CM | POA: Diagnosis not present

## 2014-07-06 DIAGNOSIS — I6521 Occlusion and stenosis of right carotid artery: Secondary | ICD-10-CM | POA: Diagnosis not present

## 2014-07-06 DIAGNOSIS — M19011 Primary osteoarthritis, right shoulder: Secondary | ICD-10-CM | POA: Diagnosis not present

## 2014-07-06 DIAGNOSIS — M25511 Pain in right shoulder: Secondary | ICD-10-CM

## 2014-07-06 DIAGNOSIS — I63311 Cerebral infarction due to thrombosis of right middle cerebral artery: Secondary | ICD-10-CM

## 2014-07-06 DIAGNOSIS — M7551 Bursitis of right shoulder: Secondary | ICD-10-CM | POA: Diagnosis not present

## 2014-07-06 DIAGNOSIS — I6529 Occlusion and stenosis of unspecified carotid artery: Secondary | ICD-10-CM | POA: Insufficient documentation

## 2014-07-06 IMAGING — CT CT SHOULDER*R* W/O CM
3 series · 8 of 14 positions shown, 9 images · non-contrast
Comparison: None.

CLINICAL DATA: Worsening right shoulder pain over the past 4-5
years.

EXAM:
CT OF THE RIGHT SHOULDER WITHOUT CONTRAST
TECHNIQUE: Multidetector CT imaging was performed according to the standard
protocol. Multiplanar CT image reconstructions were also generated.

[Series 5: shoulder soft · axial · 0.53mm/px · z∈[-126,-59]mm · 2 of 82 slices shown, 3 images]
[im 28/82  soft-tissue]
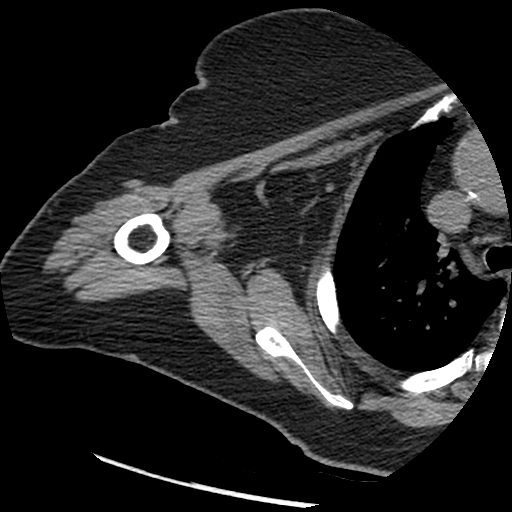
[im 28/82  bone]
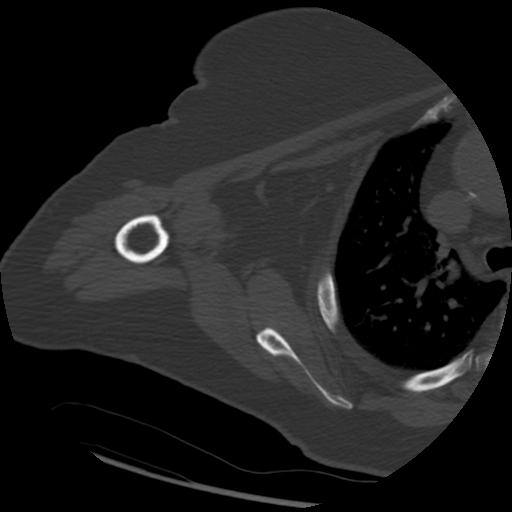
[im 55/82  bone]
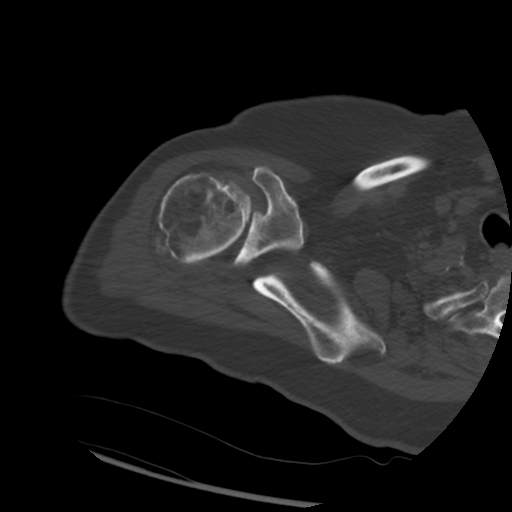

[Series 300: sag soft · oblique · 0.53mm/px · 3 of 111 slices shown]
[im 28/111  soft-tissue]
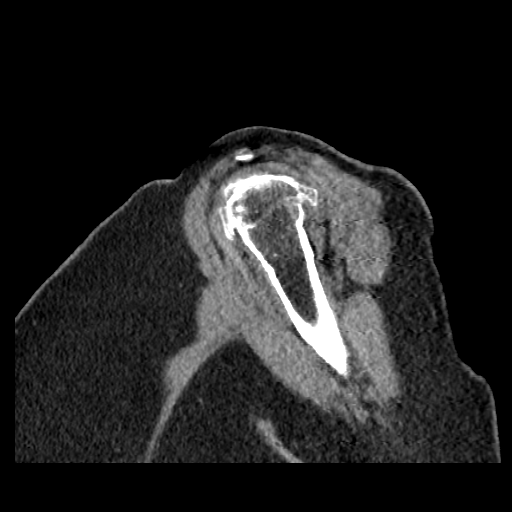
[im 56/111  soft-tissue]
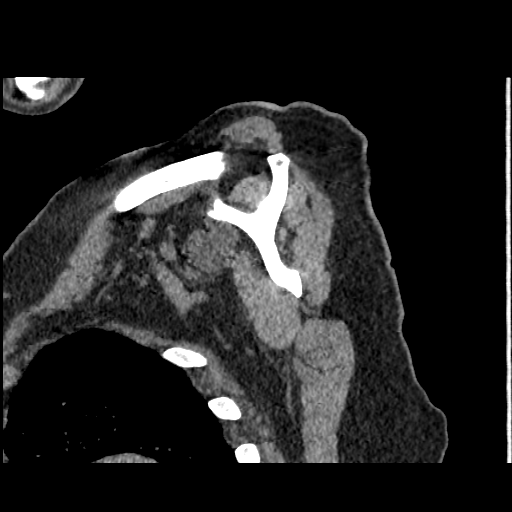
[im 83/111  soft-tissue]
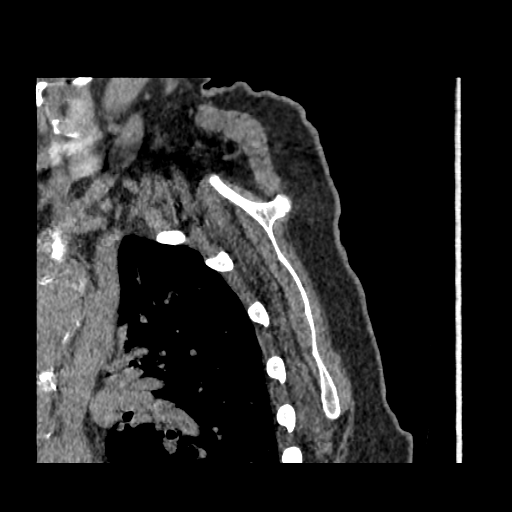

[Series 301: cor soft · oblique · 0.53mm/px · 3 of 107 slices shown]
[im 27/107  soft-tissue]
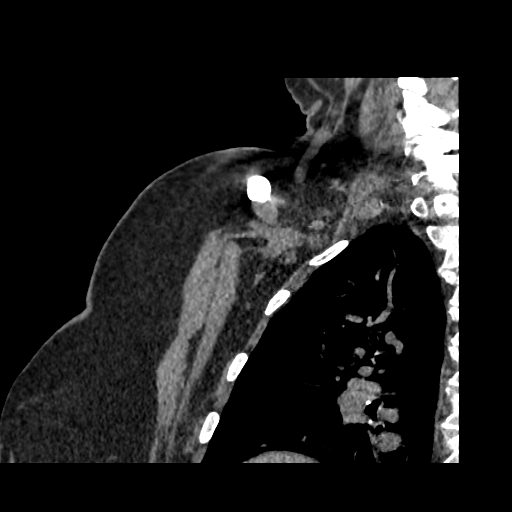
[im 54/107  soft-tissue]
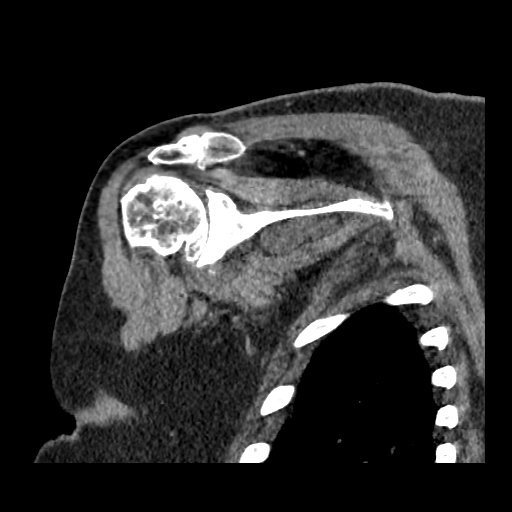
[im 80/107  soft-tissue]
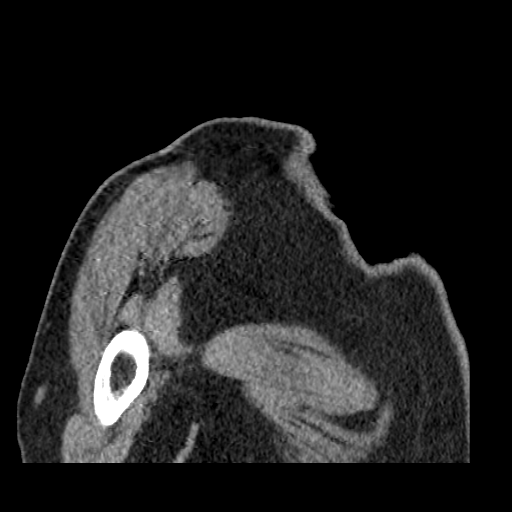

[8 of 14 positions shown; findings below may reference images not displayed]

FINDINGS: There is severe glenohumeral joint degenerative disease with joint
space narrowing, osteophytic spurring, bony eburnation and
subchondral cystic change. The glenoid is mildly widened and
thinned.

Mild to moderate AC joint degenerative changes. The acromion is
relatively flat or type 1 and shape. Small bony density superior to
the distal clavicle could be an old avulsion fracture or unfused
secondary ossification center.

The rotator cuff tendons are grossly intact. Mild calcific
tendinitis. Significant subcoracoid/sub scapularis bursitis is
noted.

The visualize right lung demonstrates emphysematous changes but no
worrisome pulmonary lesions.

Suspect fusiform aneurysmal dilatation of the ascending aorta. Chest
CT with contrast suggested for further evaluation.
IMPRESSION: 1. Severe glenohumeral joint degenerative disease as described
above.
2. Mild to moderate AC joint degenerative changes.
3. Grossly intact rotator cuff tendons.
4. Significant subcoracoid/subscapularis bursitis.
5. Suspect fusiform aneurysmal dilatation of the ascending thoracic
aorta. Chest CT with contrast suggested for further evaluation.

## 2014-07-06 NOTE — Progress Notes (Signed)
NEUROLOGY FOLLOW UP OFFICE NOTE  Peggy [name] [id number]  HISTORY OF PRESENT ILLNESS: Peggy [name] is a 80 year old right-handed woman with hypertension, arthritis and mitral valve prolapse who follows up for stroke.  She is accompanied by her husband.  UPDATE: Plan with orthopedist to have a right total shoulder replacement and was asked for clearance regarding stopping Plavix.  I called her in to discuss the risks and benefits.  She is aware that discontinuing Plavix will increase risk for stroke.  I told her that I preferred to postpone surgeries if possible, until 6 months out from the stroke.  She said it is actually scheduled for September but is willing to postpone it for another month.    HISTORY: On 04/22/14, patient underwent left shoulder replacement.  The next day, she collapsed.  She had left facial weakness, as well as left arm and leg weakness.  She was not a tPA candidate due to the recent surgery.  She was transferred to [facility].  CTA of the head and neck showed right MCA occlusion with reconstitution of flow within its branches.  As well as severe proximal right ICA stenosis.  MRI of brain confirmed acute small right MCA territory infarcts.  Echo showed EF of 65-70% with no cardiac source of embolus.  LDL was 63, triglycerides 113 and Hgb A1c 5.4.  She underwent right MCA thrombectomy and right carotid angioplasty and stenting.  During her hospitalization, she developed  hypotension and was started on Florinef.  Plavix was added to ASA 81mg  daily.  She is taking Lipitor 40mg .  PAST MEDICAL HISTORY: Past Medical History  Diagnosis Date  . Hypertension   . Arthritis   . Mitral valve prolapse     takes antiabiotic with procedures  . Osteoarthritis of left shoulder, primary localized 04/22/2014    MEDICATIONS: Current Outpatient Prescriptions on File Prior to Visit  Medication Sig Dispense Refill  . aspirin 81 MG tablet Take 81 mg by mouth daily.    Marland Kitchen  atorvastatin (LIPITOR) 20 MG tablet Take 20 mg by mouth daily.    . baclofen (LIORESAL) 10 MG tablet Take 1 tablet (10 mg total) by mouth 3 (three) times daily. As needed for muscle spasm 50 tablet 0  . beta carotene w/minerals (OCUVITE) tablet Take 1 tablet by mouth daily.    . calcium carbonate 200 MG capsule Take 250 mg by mouth 2 (two) times daily with a meal.    . cholecalciferol (VITAMIN D) 1000 UNITS tablet Take 500 Units by mouth daily.    . fludrocortisone (FLORINEF) 0.1 MG tablet Take 0.1 mg by mouth daily.    Marland Kitchen glucosamine-chondroitin 500-400 MG tablet Take 1 tablet by mouth 4 (four) times daily.    Marland Kitchen HYDROcodone-acetaminophen (NORCO) 10-325 MG per tablet Take 1-2 tablets by mouth every 6 (six) hours as needed. 75 tablet 0  . lisinopril (PRINIVIL,ZESTRIL) 20 MG tablet Take 20 mg by mouth daily.    . metoprolol succinate (TOPROL-XL) 25 MG 24 hr tablet Take 25 mg by mouth daily.     . ondansetron (ZOFRAN) 4 MG tablet Take 1 tablet (4 mg total) by mouth every 8 (eight) hours as needed for nausea or vomiting. 30 tablet 0  . pregabalin (LYRICA) 75 MG capsule Take 75 mg by mouth 2 (two) times daily.    . sennosides-docusate sodium (SENOKOT-S) 8.6-50 MG tablet Take 2 tablets by mouth daily. 30 tablet 1  . vitamin A 7500 UNIT capsule Take 7,500 Units  by mouth daily.     No current facility-administered medications on file prior to visit.    ALLERGIES: No Known Allergies  FAMILY HISTORY: Family History  Problem Relation Age of Onset  . Diabetes Mother   . Heart failure Father   . Cancer Mother   . Stroke Maternal Grandmother   . Cancer Maternal Grandfather     unknown  . Stroke Paternal Grandmother     SOCIAL HISTORY: History   Social History  . Marital Status: Married    Spouse Name: N/A  . Number of Children: N/A  . Years of Education: N/A   Occupational History  . Not on file.   Social History Main Topics  . Smoking status: Never Smoker   . Smokeless tobacco: Never  Used  . Alcohol Use: 0.0 oz/week    0 Standard drinks or equivalent per week     Comment: occasionally  . Drug Use: No  . Sexual Activity:    Partners: Male   Other Topics Concern  . Not on file   Social History Narrative    REVIEW OF SYSTEMS: Constitutional: No fevers, chills, or sweats, no generalized fatigue, change in appetite Eyes: No visual changes, double vision, eye pain Ear, nose and throat: No hearing loss, ear pain, nasal congestion, sore throat Cardiovascular: No chest pain, palpitations Respiratory:  No shortness of breath at rest or with exertion, wheezes GastrointestinaI: No nausea, vomiting, diarrhea, abdominal pain, fecal incontinence Genitourinary:  No dysuria, urinary retention or frequency Musculoskeletal:  No neck pain, back pain Integumentary: No rash, pruritus, skin lesions Neurological: as above Psychiatric: No depression, insomnia, anxiety Endocrine: No palpitations, fatigue, diaphoresis, mood swings, change in appetite, change in weight, increased thirst Hematologic/Lymphatic:  No anemia, purpura, petechiae. Allergic/Immunologic: no itchy/runny eyes, nasal congestion, recent allergic reactions, rashes  PHYSICAL EXAM: Filed Vitals:   07/06/14 1144  BP: 116/70  Pulse: 89   General: No acute distress  IMPRESSION: Right MCA stroke, possibly thrombo-embolic from right carotid artery. Right carotid stenosis status post stent  PLAN: We discussed the risks and benefits of discontinuing the Plavix for surgery, including the risk of stroke.  I would prefer postponing surgery until October.  Plavix should be stopped 5 days prior to surgery.  15 minutes spent face to face with patient and husband, 100% spent discussing risks and benefits of stopping Plavix for surgery.  They both comprehend the conversation and I answered all questions to their satisfaction.  Metta Clines, DO  CC:  Floyde Parkins, MD  Aura Dials, MD

## 2014-07-12 ENCOUNTER — Ambulatory Visit: Payer: Medicare Other | Admitting: Neurology

## 2014-07-14 DIAGNOSIS — I6521 Occlusion and stenosis of right carotid artery: Secondary | ICD-10-CM | POA: Diagnosis not present

## 2014-07-27 DIAGNOSIS — Z96612 Presence of left artificial shoulder joint: Secondary | ICD-10-CM | POA: Diagnosis not present

## 2014-09-08 DIAGNOSIS — H2513 Age-related nuclear cataract, bilateral: Secondary | ICD-10-CM | POA: Diagnosis not present

## 2014-09-21 ENCOUNTER — Encounter: Payer: Self-pay | Admitting: Neurology

## 2014-09-21 ENCOUNTER — Ambulatory Visit (INDEPENDENT_AMBULATORY_CARE_PROVIDER_SITE_OTHER): Payer: Medicare Other | Admitting: Neurology

## 2014-09-21 VITALS — BP 118/70 | HR 62 | Temp 97.5°F | Resp 17 | Ht 68.0 in | Wt 207.8 lb

## 2014-09-21 DIAGNOSIS — I63311 Cerebral infarction due to thrombosis of right middle cerebral artery: Secondary | ICD-10-CM

## 2014-09-21 DIAGNOSIS — I639 Cerebral infarction, unspecified: Secondary | ICD-10-CM | POA: Diagnosis not present

## 2014-09-21 DIAGNOSIS — I6521 Occlusion and stenosis of right carotid artery: Secondary | ICD-10-CM

## 2014-09-21 NOTE — Patient Instructions (Signed)
Continue Plavix.  It should be discontinued 5 days prior to surgery.  May resume afterwards as per the orthopedist. Continue Lipitor Follow up in 6 months.

## 2014-09-21 NOTE — Progress Notes (Signed)
NEUROLOGY FOLLOW UP OFFICE NOTE  Peggy [name] [id number]  HISTORY OF PRESENT ILLNESS: Peggy [name] is a 80 year old right-handed woman with hypertension, arthritis and mitral valve prolapse who follows up for stroke.  She is accompanied by her husband who provides some history.  UPDATE: ASA was discontinued in July and she remains on Plavix.  Plan with orthopedist to have a right total shoulder replacement and was asked for clearance regarding stopping Plavix.  I called her in to discuss the risks and benefits.  She is aware that discontinuing Plavix will increase risk for stroke.  I told her that I preferred to postpone surgeries if possible, until 6 months out from the stroke.  She said it is actually scheduled for September but is willing to postpone it for another month.  She is scheduled for right total shoulder replacement in October.  HISTORY: On 04/22/14, patient underwent left shoulder replacement.  The next day, she collapsed.  She had left facial weakness, as well as left arm and leg weakness.  She was not a tPA candidate due to the recent surgery.  She was transferred to [facility].  CTA of the head and neck showed right MCA occlusion with reconstitution of flow within its branches.  As well as severe proximal right ICA stenosis.  MRI of brain confirmed acute small right MCA territory infarcts.  Echo showed EF of 65-70% with no cardiac source of embolus.  LDL was 63, triglycerides 113 and Hgb A1c 5.4.  She underwent right MCA thrombectomy and right internal carotid angioplasty and stenting.  During her hospitalization, she developed  hypotension and was started on Florinef.  Plavix was added to ASA 81mg  daily.  She is taking Lipitor 40mg .  PAST MEDICAL HISTORY: Past Medical History  Diagnosis Date  . Hypertension   . Arthritis   . Mitral valve prolapse     takes antiabiotic with procedures  . Osteoarthritis of left shoulder, primary localized 04/22/2014     MEDICATIONS: Current Outpatient Prescriptions on File Prior to Visit  Medication Sig Dispense Refill  . calcium carbonate 200 MG capsule Take 250 mg by mouth 2 (two) times daily with a meal.    . cholecalciferol (VITAMIN D) 1000 UNITS tablet Take 500 Units by mouth daily.    Marland Kitchen glucosamine-chondroitin 500-400 MG tablet Take 1 tablet by mouth 4 (four) times daily.    Marland Kitchen lisinopril (PRINIVIL,ZESTRIL) 20 MG tablet Take 20 mg by mouth daily.    . metoprolol succinate (TOPROL-XL) 25 MG 24 hr tablet Take 25 mg by mouth daily.     . pregabalin (LYRICA) 75 MG capsule Take 75 mg by mouth 2 (two) times daily.    . fludrocortisone (FLORINEF) 0.1 MG tablet Take 0.1 mg by mouth daily.     No current facility-administered medications on file prior to visit.    ALLERGIES: No Known Allergies  FAMILY HISTORY: Family History  Problem Relation Age of Onset  . Diabetes Mother   . Heart failure Father   . Cancer Mother   . Stroke Maternal Grandmother   . Cancer Maternal Grandfather     unknown  . Stroke Paternal Grandmother     SOCIAL HISTORY: Social History   Social History  . Marital Status: Married    Spouse Name: N/A  . Number of Children: N/A  . Years of Education: N/A   Occupational History  . Not on file.   Social History Main Topics  . Smoking status: Never Smoker   .  Smokeless tobacco: Never Used  . Alcohol Use: 0.0 oz/week    0 Standard drinks or equivalent per week     Comment: occasionally  . Drug Use: No  . Sexual Activity:    Partners: Male   Other Topics Concern  . Not on file   Social History Narrative    REVIEW OF SYSTEMS: Constitutional: No fevers, chills, or sweats, no generalized fatigue, change in appetite Eyes: No visual changes, double vision, eye pain Ear, nose and throat: No hearing loss, ear pain, nasal congestion, sore throat Cardiovascular: No chest pain, palpitations Respiratory:  No shortness of breath at rest or with exertion,  wheezes GastrointestinaI: No nausea, vomiting, diarrhea, abdominal pain, fecal incontinence Genitourinary:  No dysuria, urinary retention or frequency Musculoskeletal:  No neck pain, back pain Integumentary: No rash, pruritus, skin lesions Neurological: as above Psychiatric: No depression, insomnia, anxiety Endocrine: No palpitations, fatigue, diaphoresis, mood swings, change in appetite, change in weight, increased thirst Hematologic/Lymphatic:  No anemia, purpura, petechiae. Allergic/Immunologic: no itchy/runny eyes, nasal congestion, recent allergic reactions, rashes  PHYSICAL EXAM: Filed Vitals:   09/21/14 1027  BP: 118/70  Pulse: 62  Temp: 97.5 F (36.4 C)  Resp: 17   General: No acute distress.  Patient appears well-groomed.   Head:  Normocephalic/atraumatic Eyes:  Fundoscopic exam unremarkable without vessel changes, exudates, hemorrhages or papilledema. Neck: supple, no paraspinal tenderness, full range of motion Heart:  Regular rate and rhythm Lungs:  Clear to auscultation bilaterally Back: No paraspinal tenderness Neurological Exam: alert and oriented to person, place, and time. Attention span and concentration intact, recent and remote memory intact, fund of knowledge intact.  Speech fluent and not dysarthric, language intact.  Mild left facial weakness.  Otherwise, CN II-XII intact. Fundoscopic exam unremarkable without vessel changes, exudates, hemorrhages or papilledema.  Bulk and tone normal, shoulder testing limited bilaterally, and at least 4+/5 in right shoulder, trace left grip strength, but otherwise muscle strength 5/5 throughout.  Sensation to pinprick and vibration reduced in feet.  Deep tendon reflexes trace in upper extremities and absent in lower extremities, toes downgoing.  Finger to nose and heel to shin testing intact.  Gait wide-based, Romberg with sway.  IMPRESSION: Right MCA stroke, possibly thrombo-embolic from right carotid artery. Right carotid  stenosis status post stent  PLAN: Plavix for secondary stroke prevention.  She should discontinue Plavix 5 days prior to surgery.  She should resume Plavix following surgery. Lipitor (LDL at goal of less than 70) Blood pressure control. Mediterranean diet Follow up in 6 months.  15 minutes spent face to face with patient, over 50% spent discussing management.  Metta Clines, DO  CC:  Aura Dials, MD  Caryl Comes, MD

## 2014-09-28 DIAGNOSIS — H2513 Age-related nuclear cataract, bilateral: Secondary | ICD-10-CM | POA: Diagnosis not present

## 2014-09-28 DIAGNOSIS — D3131 Benign neoplasm of right choroid: Secondary | ICD-10-CM | POA: Diagnosis not present

## 2014-10-14 ENCOUNTER — Other Ambulatory Visit: Payer: Self-pay | Admitting: Orthopedic Surgery

## 2014-10-20 DIAGNOSIS — I6521 Occlusion and stenosis of right carotid artery: Secondary | ICD-10-CM | POA: Diagnosis not present

## 2014-10-20 DIAGNOSIS — I63511 Cerebral infarction due to unspecified occlusion or stenosis of right middle cerebral artery: Secondary | ICD-10-CM | POA: Diagnosis not present

## 2014-10-20 DIAGNOSIS — I6523 Occlusion and stenosis of bilateral carotid arteries: Secondary | ICD-10-CM | POA: Diagnosis not present

## 2014-10-20 DIAGNOSIS — Z95828 Presence of other vascular implants and grafts: Secondary | ICD-10-CM | POA: Diagnosis not present

## 2014-10-21 DIAGNOSIS — Z23 Encounter for immunization: Secondary | ICD-10-CM | POA: Diagnosis not present

## 2014-10-21 DIAGNOSIS — I679 Cerebrovascular disease, unspecified: Secondary | ICD-10-CM | POA: Diagnosis not present

## 2014-10-21 DIAGNOSIS — M1990 Unspecified osteoarthritis, unspecified site: Secondary | ICD-10-CM | POA: Diagnosis not present

## 2014-10-21 DIAGNOSIS — I10 Essential (primary) hypertension: Secondary | ICD-10-CM | POA: Diagnosis not present

## 2014-10-21 DIAGNOSIS — I779 Disorder of arteries and arterioles, unspecified: Secondary | ICD-10-CM | POA: Diagnosis not present

## 2014-10-21 DIAGNOSIS — G609 Hereditary and idiopathic neuropathy, unspecified: Secondary | ICD-10-CM | POA: Diagnosis not present

## 2014-10-21 DIAGNOSIS — E785 Hyperlipidemia, unspecified: Secondary | ICD-10-CM | POA: Diagnosis not present

## 2014-10-26 DIAGNOSIS — Z96612 Presence of left artificial shoulder joint: Secondary | ICD-10-CM | POA: Diagnosis not present

## 2014-10-28 ENCOUNTER — Encounter (HOSPITAL_COMMUNITY)
Admission: RE | Admit: 2014-10-28 | Discharge: 2014-10-28 | Disposition: A | Discharge status: Home / Self Care | Payer: Medicare Other | Source: Ambulatory Visit | Attending: Orthopedic Surgery | Admitting: Orthopedic Surgery

## 2014-10-28 ENCOUNTER — Encounter (HOSPITAL_COMMUNITY): Payer: Self-pay | Admitting: Vascular Surgery

## 2014-10-28 ENCOUNTER — Encounter (HOSPITAL_COMMUNITY): Payer: Self-pay

## 2014-10-28 DIAGNOSIS — M19011 Primary osteoarthritis, right shoulder: Secondary | ICD-10-CM | POA: Insufficient documentation

## 2014-10-28 DIAGNOSIS — Z7902 Long term (current) use of antithrombotics/antiplatelets: Secondary | ICD-10-CM | POA: Diagnosis not present

## 2014-10-28 DIAGNOSIS — Z8673 Personal history of transient ischemic attack (TIA), and cerebral infarction without residual deficits: Secondary | ICD-10-CM | POA: Diagnosis not present

## 2014-10-28 DIAGNOSIS — Z01818 Encounter for other preprocedural examination: Secondary | ICD-10-CM | POA: Insufficient documentation

## 2014-10-28 DIAGNOSIS — I6523 Occlusion and stenosis of bilateral carotid arteries: Secondary | ICD-10-CM | POA: Diagnosis not present

## 2014-10-28 DIAGNOSIS — I10 Essential (primary) hypertension: Secondary | ICD-10-CM | POA: Diagnosis not present

## 2014-10-28 DIAGNOSIS — G629 Polyneuropathy, unspecified: Secondary | ICD-10-CM | POA: Insufficient documentation

## 2014-10-28 DIAGNOSIS — Z79899 Other long term (current) drug therapy: Secondary | ICD-10-CM | POA: Insufficient documentation

## 2014-10-28 DIAGNOSIS — Z01812 Encounter for preprocedural laboratory examination: Secondary | ICD-10-CM | POA: Diagnosis not present

## 2014-10-28 HISTORY — DX: Polyneuropathy, unspecified: G62.9

## 2014-10-28 HISTORY — DX: Nonrheumatic aortic (valve) stenosis: I35.0

## 2014-10-28 HISTORY — DX: Cerebral infarction, unspecified: I63.9

## 2014-10-28 LAB — CBC
HCT: 44.1 % (ref 36.0–46.0)
HEMOGLOBIN: 14.7 g/dL (ref 12.0–15.0)
MCH: 29.3 pg (ref 26.0–34.0)
MCHC: 33.3 g/dL (ref 30.0–36.0)
MCV: 88 fL (ref 78.0–100.0)
Platelets: 302 10*3/uL (ref 150–400)
RBC: 5.01 MIL/uL (ref 3.87–5.11)
RDW: 13 % (ref 11.5–15.5)
WBC: 7.9 10*3/uL (ref 4.0–10.5)

## 2014-10-28 LAB — BASIC METABOLIC PANEL
Anion gap: 5 (ref 5–15)
BUN: 18 mg/dL (ref 6–20)
CO2: 29 mmol/L (ref 22–32)
CREATININE: 0.72 mg/dL (ref 0.44–1.00)
Calcium: 9.1 mg/dL (ref 8.9–10.3)
Chloride: 105 mmol/L (ref 101–111)
GFR calc Af Amer: >60 mL/min (ref >60)
Glucose, Bld: 105 mg/dL — ABNORMAL HIGH (ref 65–99)
Potassium: 4.4 mmol/L (ref 3.5–5.1)
SODIUM: 139 mmol/L (ref 135–145)

## 2014-10-28 LAB — SURGICAL PCR SCREEN
MRSA, PCR: NEGATIVE
STAPHYLOCOCCUS AUREUS: NEGATIVE

## 2014-10-28 NOTE — Pre-Procedure Instructions (Signed)
    Peggy [name]  10/28/2014      [place], [place] - [address] [place] [address] Phone: [phone] Fax: [phone]    Your procedure is scheduled on Tuesday, Oct. 25  Report to [facility] Admitting at 5:30 A.M.  Call this number if you have problems the morning of surgery:  [phone]   Remember:  Do not eat food or drink liquids after midnight Monday, Oct 24  Take these medicines the morning of surgery with A SIP OF WATER :metoprolol, lyrica              Stop plavix, fish oil 5 days prior to surgery per dr. Harvel Ricks    Do not wear jewelry, make-up or nail polish.  Do not wear lotions, powders, or perfumes.  You may wear deodorant.  Do not shave 48 hours prior to surgery.  Men may shave face and neck.  Do not bring valuables to the hospital.  [facility] is not responsible for any belongings or valuables.  Contacts, dentures or bridgework may not be worn into surgery.  Leave your suitcase in the car.  After surgery it may be brought to your room.  For patients admitted to the hospital, discharge time will be determined by your treatment team.  Patients discharged the day of surgery will not be allowed to drive home.   Name and phone number of your driver:    Special instructions:  Review preparing  For surgery  Please read over the following fact sheets that you were given. Pain Booklet, Coughing and Deep Breathing, MRSA Information and Surgical Site Infection Prevention

## 2014-10-28 NOTE — Progress Notes (Addendum)
PCP: Dr. Danny Lawless Neurologist: Dr. Harvel Ricks at [facility] [phone] and Dr. Metta Clines at [facility]  PT. States Dr. Mardelle Matte has clearance letters for Dr. Randye Lobo and Dr. Lonia Mad request.  Pt. Denies she has MVP.

## 2014-10-31 ENCOUNTER — Encounter (HOSPITAL_COMMUNITY): Payer: Self-pay

## 2014-10-31 NOTE — Progress Notes (Addendum)
Anesthesia Chart Review: Patient is a 80 year old female scheduled for right total shoulder arthroplasty on 11/08/14 by Dr. Mardelle Matte.  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 She was not a candidate for IV tPA due to recent surgery, and [facility] IR was unavailable due to involvement in another case. Patient was subsequently transferred to [facility] and underwent thrombectomy and RICA angioplasty and stent on 04/23/14 (discharged 05/02/14). Other history includes non-smoker, HTN, osteoarthritis, MVP, neuropathy, back surgery, old left frontal infarct (by 04/24/14 head CT [facility]). BMI is 32 consistent with obesity.   Meds include Lipitor, Plavix, lisinopril, Toprol XL, fish oil, Lyrica.   PCP is Dr. Aura Dials, last visit 10/21/14, and is aware of surgery plans. Cardiologist is Dr. Adrian Prows who cleared patient from a cardiac standpoint.  Neurologist is Dr. Metta Clines who gave permission to hold Plavix for 5 days prior to surgery. Neurosurgeon is Dr. Caryl Comes with [facility] [phone]), last visit 10/21/14. He is aware of plans for surgery and that patient will be holding Plavix. He wants to do a CTA to confirm 10/20/14 carotid duplex findings (see below) following recovery from her shoulder surgery.   10/21/14 EKG (PCP): SR, RSR prime in V1, LAD, LAE, low voltage with rightward P-axis and rotation, possible pulmonary disease pattern. Poor r wave progression. Overall, EKG appears stable since 04/12/14.  05/31/14 Echo [facility] CV): LV cavity is normal in size. Moderate concentric hypertrophy of the LV. Normal global wall motion. Doppler evidence of grade 1 (impaired) diastolic dysfunction. Calculated EF 57%. Mild calcification of the AV annulus. Mild AV leaflet calcification. Mildly restricted  aortic valve leaflets. Trace AS. Peak gradient 11, mean gradient 6 mmHg, AVA 1.43 cm. Mild TR. Mild pulmonary hypertension. PA pressure estimated to be 32 mmHg. Repeat echo in two years recommended.  04/21/14 Nuclear stress test: IMPRESSION: 1. This is a low risk study with small area, mild severity reversible defect in the basal and mid inferolateral walls that might be caused by an artifact secondary to extracardiac uptake on resting images.  2. LVEF is 72% with septal dyskinesis. 3. No ECG changes during Lexiscan infusion. 4. Medical therapy is recommended. (Dr. Ena Dawley)  10/20/14 Carotid Ultrasound ([facility]): 1. 70-99% stenosis mid right ICA, previously about 60% (07/14/14). 2. Mild stenosis proximal left ICA, no significant change from prior study. 2. Both vertebral arteries are patent with antegrade flow.  04/23/2014 CTA of the neck and circle of Willis (PRE-RICA stent; see Care Everywhere).IMPRESSION:  1. Right MCA occlusion with reconstitution of flow within the MCA branches.  2. Small attenuated right ICA, likely secondary to severe proximal stenosis.  3. Severely attenuated but apparently patent right ICA from the skull base to the bifurcation. Limited exam due to image degradation from patient positioning artifact and venous contamination. (S/P Mechanical thrombectomy of right middle cerebral artery inferior division and emergent balloon angioplasty and stenting of right internal carotid artery with proximal protection by IR 04/23/14; [facility].)  04/26/14 PCXR (Care Everywhere): Lungs are now essentially clear. Mild infiltrate seen previously have resolved. No adenopathy or pneumothoraces. No visible support lines.  Preoperative labs noted.   Earlier I discussed above with anesthesiologist Dr. Therisa Doyne. He agrees with need to clarify with Dr. Harvel Ricks about timing of CTA with recent finding of progressive RICA stenosis (70-99%). I  called and spoke with Dr. Darnelle Catalan assistant  Diane. She will forward my message to Dr. Harvel Ricks or his NP for review and have them call me back. They are in the office this week.   [facility] Short Stay Center/Anesthesiology Phone [phone] 10/31/2014 2:19 PM  Addendum: Per Diane, patient is getting her CTA on Monday and will speak with Dr. Harvel Ricks then. I have updated Joya Gaskins, PA-C with Dr. Mardelle Matte.  [facility] Short Stay Center/Anesthesiology Phone [phone] 11/04/2014 1:24 PM  Addendum: Dr. Harvel Ricks got called in for jury duty today, so patient is going to tentatively get her CTA on 11/10/14 and follow-up with him then. She is planning to reschedule her surgery until a definitive plan is determined regarding her ICA stenosis.  [facility] Short Stay Center/Anesthesiology Phone [phone] 11/07/2014 10:13 AM

## 2014-11-08 ENCOUNTER — Inpatient Hospital Stay (HOSPITAL_COMMUNITY): Admission: RE | Admit: 2014-11-08 | Payer: Medicare Other | Source: Ambulatory Visit | Admitting: Orthopedic Surgery

## 2014-11-08 ENCOUNTER — Encounter (HOSPITAL_COMMUNITY): Admission: RE | Payer: Self-pay | Source: Ambulatory Visit

## 2014-11-08 SURGERY — ARTHROPLASTY, SHOULDER, TOTAL
Anesthesia: General | Laterality: Right

## 2014-11-10 DIAGNOSIS — I63511 Cerebral infarction due to unspecified occlusion or stenosis of right middle cerebral artery: Secondary | ICD-10-CM | POA: Diagnosis not present

## 2014-11-10 DIAGNOSIS — I6521 Occlusion and stenosis of right carotid artery: Secondary | ICD-10-CM | POA: Diagnosis not present

## 2014-11-10 DIAGNOSIS — I6523 Occlusion and stenosis of bilateral carotid arteries: Secondary | ICD-10-CM | POA: Diagnosis not present

## 2014-11-10 DIAGNOSIS — Z95828 Presence of other vascular implants and grafts: Secondary | ICD-10-CM | POA: Diagnosis not present

## 2014-11-10 DIAGNOSIS — I672 Cerebral atherosclerosis: Secondary | ICD-10-CM | POA: Diagnosis not present

## 2014-11-22 DIAGNOSIS — M19011 Primary osteoarthritis, right shoulder: Secondary | ICD-10-CM | POA: Diagnosis not present

## 2014-11-22 NOTE — Progress Notes (Addendum)
Anesthesia Chart Review: Peggy [name] is a 80 year old female scheduled for right total shoulder arthroplasty on 11/29/14 by Dr. Mardelle Matte. Patient was initially scheduled for 11/08/14, but was delayed until her neurosurgeon Dr. Caryl Comes with [facility] [phone]) could re-evaluate for possible recurrent right ICA stenosis (70-99% by 10/20/14 doppler). Since then she underwent a CTA of the neck on 11/10/14 (see Care Everywhere) that showed mild residual recurrent narrowing across the mid of the right ICA stent with 60-75% bilateral ICA stenosis. Based on these results Dr. Harvel Ricks felt patient could proceed with surgery as planned with request to keep patient normotensive during the procedure.   History includes left total shoulder arthroplasty on 04/22/14 with presentation to [facility] on 04/23/14 with acute CVA (left sided weakness, dysarthria) secondary to acute right cervical ICA occlusion and right MCA embolus. She was not a candidate for IV tPA due to recent surgery, and [facility] IR was unavailable due to involvement in another case. Patient was subsequently transferred to [facility] and underwent thrombectomy and RICA angioplasty and stent on 04/23/14 (discharged 05/02/14). Other history includes non-smoker, HTN, osteoarthritis, MVP, neuropathy, back surgery, old left frontal infarct (by 04/24/14 head CT [facility]). BMI is consistent with obesity. Dr. Harvel Ricks was already aware that Plavix was being held for surgery.   Meds include Lipitor, Plavix, lisinopril, Toprol XL, fish oil, Lyrica.   PCP is Dr. Aura Dials, last visit 10/21/14, and is aware of surgery plans. Cardiologist is Dr. Adrian Prows who cleared patient from a cardiac standpoint.  Neurologist is Dr. Metta Clines who gave permission to hold Plavix for 5 days prior to surgery.   10/21/14 EKG (PCP): SR, RSR prime in V1, LAD, LAE, low voltage with rightward P-axis and rotation, possible pulmonary disease pattern. Poor r wave  progression. Overall, EKG appears stable since 04/12/14.  05/31/14 Echo [facility] CV): LV cavity is normal in size. Moderate concentric hypertrophy of the LV. Normal global wall motion. Doppler evidence of grade 1 (impaired) diastolic dysfunction. Calculated EF 57%. Mild calcification of the AV annulus. Mild AV leaflet calcification. Mildly restricted aortic valve leaflets. Trace AS. Peak gradient 11, mean gradient 6 mmHg, AVA 1.43 cm. Mild TR. Mild pulmonary hypertension. PA pressure estimated to be 32 mmHg. Repeat echo in two years recommended.  04/21/14 Nuclear stress test: IMPRESSION: 1. This is a low risk study with small area, mild severity reversible defect in the basal and mid inferolateral walls that might be caused by an artifact secondary to extracardiac uptake on resting images.  2. LVEF is 72% with septal dyskinesis. 3. No ECG changes during Lexiscan infusion. 4. Medical therapy is recommended. (Dr. Ena Dawley)  10/20/14 Carotid Ultrasound ([facility]): 1. 70-99% stenosis mid right ICA, previously about 60% (07/14/14). 2. Mild stenosis proximal left ICA, no significant change from prior study. 2. Both vertebral arteries are patent with antegrade flow.  11/10/14 CTA neck (see Care Everywhere): IMPRESSION: Interval placement of a stent in the right internal carotid  artery stenosis. Theres is a mild residual recurrent narrowing across the middle of the stent. 60-75% stenosis internal carotid arteries bilaterally. No proximal stenosis.   04/26/14 PCXR (Care Everywhere): Lungs are now essentially clear. Mild infiltrate seen previously have resolved. No adenopathy or pneumothoraces. No visible support lines.  Preoperative labs PENDING her PAT visit 11/23/14.  Additional input following her PAT visit.   [facility] Short Stay Center/Anesthesiology Phone [phone] 11/22/2014 3:32 PM  Addendum: Reviewed above with anesthesiologist Dr. Marcie Bal  yesterday afternoon. He  anticipated that she could proceed as planned since critical ICA stenosis had been ruled out, and she had been cleared by neurology and neurosurgery. Preoperative labs noted. A1C is pending.  Anesthesiologist evaluation on the day of surgery to ensure no acute changes prior to proceeding.  [facility] Short Stay Center/Anesthesiology Phone [phone] 11/23/2014 12:48 PM

## 2014-11-22 NOTE — Pre-Procedure Instructions (Signed)
Peggy [name]  11/22/2014      [facility] [place] - [place], [place] - [address] [place] [address] Phone: [phone] Fax: [phone]    Your procedure is scheduled on Tuesday, November 29, 2014  Report to [facility] Admitting at 8:00 A.M.  Call this number if you have problems the morning of surgery:  [phone]   Remember:  Do not eat food or drink liquids after midnight Monday, November 28, 2014  Take these medicines the morning of surgery with A SIP OF WATER :metoprolol succinate (TOPROL-XL), pregabalin (LYRICA).  Per MD's instructions, stop taking PLAVIX 5 days prior to surgery.   Stop taking Aspirin, vitamins,  and herbal medications such as glucosamine-chondroitin and Omega-3 Fatty Acids (FISH OIL).  Do not take any NSAIDs ie: Ibuprofen, Advil, Naproxen or any medication containing Aspirin; last dose is today.   Do not wear jewelry, make-up or nail polish.  Do not wear lotions, powders, or perfumes.  You may not wear deodorant.  Do not shave 48 hours prior to surgery.    Do not bring valuables to the hospital.  [facility] is not responsible for any belongings or valuables.  Contacts, dentures or bridgework may not be worn into surgery.  Leave your suitcase in the car.  After surgery it may be brought to your room.  For patients admitted to the hospital, discharge time will be determined by your treatment team.  Patients discharged the day of surgery will not be allowed to drive home.      [place] - Preparing for Surgery  Before surgery, you can play an important role.  Because skin is not sterile, your skin needs to be as free of germs as possible.  You can reduce the number of germs on you skin by washing with CHG (chlorahexidine gluconate) soap before surgery.  CHG is an antiseptic cleaner which kills germs and bonds with the skin to continue killing germs even after washing.  Please DO NOT  use if you have an allergy to CHG or antibacterial soaps.  If your skin becomes reddened/irritated stop using the CHG and inform your nurse when you arrive at Short Stay.  Do not shave (including legs and underarms) for at least 48 hours prior to the first CHG shower.  You may shave your face.  Please follow these instructions carefully:   1.  Shower with CHG Soap the night before surgery and the morning of Surgery.  2.  If you choose to wash your hair, wash your hair first as usual with your normal shampoo.  3.  After you shampoo, rinse your hair and body thoroughly to remove the Shampoo.  4.  Use CHG as you would any other liquid soap.  You can apply chg directly  to the skin and wash gently with scrungie or a clean washcloth.  5.  Apply the CHG Soap to your body ONLY FROM THE NECK DOWN.  Do not use on open wounds or open sores.  Avoid contact with your eyes, ears, mouth and genitals (private parts).  Wash genitals (private parts) with your normal soap.  6.  Wash thoroughly, paying special attention to the area where your surgery will be performed.  7.  Thoroughly rinse your body with warm water from the neck down.  8.  DO NOT shower/wash with your normal soap after using and rinsing off the CHG Soap.  9.  Pat yourself dry with  a clean towel.            10.  Wear clean pajamas.            11.  Place clean sheets on your bed the night of your first shower and do not sleep with pets.  Day of Surgery  Do not apply any lotions/deodorants the morning of surgery.  Please wear clean clothes to the hospital/surgery center.  Please read over the following fact sheets that you were given. Pain Booklet, Coughing and Deep Breathing, Blood Transfusion Information, MRSA Information and Surgical Site Infection Prevention

## 2014-11-23 ENCOUNTER — Encounter (HOSPITAL_COMMUNITY): Payer: Self-pay

## 2014-11-23 ENCOUNTER — Encounter (HOSPITAL_COMMUNITY)
Admission: RE | Admit: 2014-11-23 | Discharge: 2014-11-23 | Disposition: A | Discharge status: Home / Self Care | Payer: Medicare Other | Source: Ambulatory Visit | Attending: Orthopedic Surgery | Admitting: Orthopedic Surgery

## 2014-11-23 DIAGNOSIS — M19011 Primary osteoarthritis, right shoulder: Secondary | ICD-10-CM | POA: Insufficient documentation

## 2014-11-23 DIAGNOSIS — Z01818 Encounter for other preprocedural examination: Secondary | ICD-10-CM | POA: Insufficient documentation

## 2014-11-23 DIAGNOSIS — Z79899 Other long term (current) drug therapy: Secondary | ICD-10-CM | POA: Diagnosis not present

## 2014-11-23 HISTORY — DX: Urgency of urination: R39.15

## 2014-11-23 HISTORY — DX: Presence of spectacles and contact lenses: Z97.3

## 2014-11-23 HISTORY — DX: Hyperlipidemia, unspecified: E78.5

## 2014-11-23 HISTORY — DX: Cardiac arrhythmia, unspecified: I49.9

## 2014-11-23 LAB — CBC
HEMATOCRIT: 45.9 % (ref 36.0–46.0)
HEMOGLOBIN: 14.9 g/dL (ref 12.0–15.0)
MCH: 29.1 pg (ref 26.0–34.0)
MCHC: 32.5 g/dL (ref 30.0–36.0)
MCV: 89.6 fL (ref 78.0–100.0)
Platelets: 303 10*3/uL (ref 150–400)
RBC: 5.12 MIL/uL — AB (ref 3.87–5.11)
RDW: 13 % (ref 11.5–15.5)
WBC: 5.8 10*3/uL (ref 4.0–10.5)

## 2014-11-23 LAB — BASIC METABOLIC PANEL
ANION GAP: 10 (ref 5–15)
BUN: 15 mg/dL (ref 6–20)
CHLORIDE: 102 mmol/L (ref 101–111)
CO2: 26 mmol/L (ref 22–32)
Calcium: 9.3 mg/dL (ref 8.9–10.3)
Creatinine, Ser: 0.69 mg/dL (ref 0.44–1.00)
GFR calc non Af Amer: >60 mL/min (ref >60)
Glucose, Bld: 105 mg/dL — ABNORMAL HIGH (ref 65–99)
POTASSIUM: 4.3 mmol/L (ref 3.5–5.1)
SODIUM: 138 mmol/L (ref 135–145)

## 2014-11-23 LAB — SURGICAL PCR SCREEN
MRSA, PCR: NEGATIVE
Staphylococcus aureus: NEGATIVE

## 2014-11-23 NOTE — Progress Notes (Signed)
PCP - Dr. Aura Dials Cardiologist - Dr. Einar Gip Neurologist - Dr. Caryl Comes  EKG- 10/21/14 CXR- 04/26/14  Echo- 05/31/14  Stress test - 04/21/14 Cardiac Cath - denies  Patient denies shortness of breath and chest pain at PAT appointment.   Clearance notes are found in chart.  Per MD's instructions, patient states that she will stop Plavix and fish oil 5 days prior to surgery.

## 2014-11-24 LAB — HEMOGLOBIN A1C
HEMOGLOBIN A1C: 5.9 % — AB (ref 4.8–5.6)
MEAN PLASMA GLUCOSE: 123 mg/dL

## 2014-11-28 MED ORDER — CEFAZOLIN SODIUM-DEXTROSE 2-3 GM-% IV SOLR
2.0000 g | INTRAVENOUS | Status: AC
  Administered 2014-11-29: 2 g via INTRAVENOUS
  Filled 2014-11-28: qty 50

## 2014-11-29 ENCOUNTER — Inpatient Hospital Stay (HOSPITAL_COMMUNITY): Payer: Medicare Other | Admitting: Anesthesiology

## 2014-11-29 ENCOUNTER — Inpatient Hospital Stay (HOSPITAL_COMMUNITY)
Admission: RE | Admit: 2014-11-29 | Discharge: 2014-12-01 | DRG: 483 | Disposition: A | Discharge status: Home / Self Care | Payer: Medicare Other | Source: Ambulatory Visit | Attending: Orthopedic Surgery | Admitting: Orthopedic Surgery

## 2014-11-29 ENCOUNTER — Encounter (HOSPITAL_COMMUNITY): Payer: Self-pay | Admitting: *Deleted

## 2014-11-29 ENCOUNTER — Inpatient Hospital Stay (HOSPITAL_COMMUNITY): Payer: Medicare Other | Admitting: Vascular Surgery

## 2014-11-29 ENCOUNTER — Inpatient Hospital Stay (HOSPITAL_COMMUNITY): Payer: Medicare Other

## 2014-11-29 ENCOUNTER — Encounter (HOSPITAL_COMMUNITY)
Admission: RE | Disposition: A | Discharge status: Home / Self Care | Payer: Self-pay | Source: Ambulatory Visit | Attending: Orthopedic Surgery

## 2014-11-29 DIAGNOSIS — E785 Hyperlipidemia, unspecified: Secondary | ICD-10-CM | POA: Diagnosis present

## 2014-11-29 DIAGNOSIS — G8918 Other acute postprocedural pain: Secondary | ICD-10-CM | POA: Diagnosis not present

## 2014-11-29 DIAGNOSIS — M19011 Primary osteoarthritis, right shoulder: Principal | ICD-10-CM | POA: Diagnosis present

## 2014-11-29 DIAGNOSIS — Z823 Family history of stroke: Secondary | ICD-10-CM

## 2014-11-29 DIAGNOSIS — I739 Peripheral vascular disease, unspecified: Secondary | ICD-10-CM | POA: Diagnosis present

## 2014-11-29 DIAGNOSIS — Z7902 Long term (current) use of antithrombotics/antiplatelets: Secondary | ICD-10-CM

## 2014-11-29 DIAGNOSIS — M25511 Pain in right shoulder: Secondary | ICD-10-CM | POA: Diagnosis not present

## 2014-11-29 DIAGNOSIS — I341 Nonrheumatic mitral (valve) prolapse: Secondary | ICD-10-CM | POA: Diagnosis present

## 2014-11-29 DIAGNOSIS — G629 Polyneuropathy, unspecified: Secondary | ICD-10-CM | POA: Diagnosis present

## 2014-11-29 DIAGNOSIS — I10 Essential (primary) hypertension: Secondary | ICD-10-CM | POA: Diagnosis present

## 2014-11-29 DIAGNOSIS — Z96611 Presence of right artificial shoulder joint: Secondary | ICD-10-CM | POA: Diagnosis not present

## 2014-11-29 DIAGNOSIS — Z8673 Personal history of transient ischemic attack (TIA), and cerebral infarction without residual deficits: Secondary | ICD-10-CM

## 2014-11-29 DIAGNOSIS — Z9889 Other specified postprocedural states: Secondary | ICD-10-CM | POA: Diagnosis not present

## 2014-11-29 DIAGNOSIS — Z96612 Presence of left artificial shoulder joint: Secondary | ICD-10-CM | POA: Diagnosis present

## 2014-11-29 DIAGNOSIS — Z96619 Presence of unspecified artificial shoulder joint: Secondary | ICD-10-CM

## 2014-11-29 HISTORY — DX: Primary osteoarthritis, right shoulder: M19.011

## 2014-11-29 HISTORY — PX: TOTAL SHOULDER ARTHROPLASTY: SHX126

## 2014-11-29 SURGERY — ARTHROPLASTY, SHOULDER, TOTAL
Anesthesia: Regional | Site: Shoulder | Laterality: Right

## 2014-11-29 MED ORDER — HYDROCODONE-ACETAMINOPHEN 10-325 MG PO TABS
1.0000 | ORAL_TABLET | ORAL | Status: DC | PRN
  Administered 2014-11-29 – 2014-11-30 (×2): 2 via ORAL
  Administered 2014-11-30 – 2014-12-01 (×4): 1 via ORAL
  Filled 2014-11-29: qty 2
  Filled 2014-11-29 (×2): qty 1
  Filled 2014-11-29: qty 2
  Filled 2014-11-29 (×2): qty 1

## 2014-11-29 MED ORDER — FENTANYL CITRATE (PF) 100 MCG/2ML IJ SOLN
INTRAMUSCULAR | Status: AC
  Administered 2014-11-29: 100 ug via INTRAVENOUS
  Filled 2014-11-29: qty 2

## 2014-11-29 MED ORDER — PROMETHAZINE HCL 25 MG/ML IJ SOLN: 6.2500 mg | INTRAMUSCULAR | Status: DC | PRN

## 2014-11-29 MED ORDER — EPHEDRINE SULFATE 50 MG/ML IJ SOLN
INTRAMUSCULAR | Status: DC | PRN
  Administered 2014-11-29 (×2): 10 mg via INTRAVENOUS

## 2014-11-29 MED ORDER — PROPOFOL 10 MG/ML IV BOLUS
INTRAVENOUS | Status: DC | PRN
  Administered 2014-11-29: 140 mg via INTRAVENOUS

## 2014-11-29 MED ORDER — DIPHENHYDRAMINE HCL 12.5 MG/5ML PO ELIX: 12.5000 mg | ORAL_SOLUTION | ORAL | Status: DC | PRN

## 2014-11-29 MED ORDER — ACETAMINOPHEN 325 MG PO TABS: 650.0000 mg | ORAL_TABLET | Freq: Four times a day (QID) | ORAL | Status: DC | PRN

## 2014-11-29 MED ORDER — BACLOFEN 10 MG PO TABS: 10.0000 mg | ORAL_TABLET | Freq: Three times a day (TID) | ORAL | Status: DC

## 2014-11-29 MED ORDER — ADULT MULTIVITAMIN W/MINERALS CH
1.0000 | ORAL_TABLET | Freq: Every day | ORAL | Status: DC
  Administered 2014-11-29 – 2014-12-01 (×3): 1 via ORAL
  Filled 2014-11-29 (×3): qty 1

## 2014-11-29 MED ORDER — FENTANYL CITRATE (PF) 100 MCG/2ML IJ SOLN
100.0000 ug | Freq: Once | INTRAMUSCULAR | Status: AC
  Administered 2014-11-29: 100 ug via INTRAVENOUS
  Filled 2014-11-29: qty 2

## 2014-11-29 MED ORDER — ALUM & MAG HYDROXIDE-SIMETH 200-200-20 MG/5ML PO SUSP: 30.0000 mL | ORAL | Status: DC | PRN

## 2014-11-29 MED ORDER — METHOCARBAMOL 500 MG PO TABS
500.0000 mg | ORAL_TABLET | Freq: Four times a day (QID) | ORAL | Status: DC | PRN
  Administered 2014-11-30 (×2): 500 mg via ORAL
  Filled 2014-11-29 (×2): qty 1

## 2014-11-29 MED ORDER — CALCIUM CARBONATE ANTACID 500 MG PO CHEW
200.0000 mg | CHEWABLE_TABLET | Freq: Two times a day (BID) | ORAL | Status: DC
  Administered 2014-11-29 – 2014-11-30 (×3): 200 mg via ORAL
  Filled 2014-11-29 (×3): qty 1

## 2014-11-29 MED ORDER — SODIUM CHLORIDE 0.9 % IV SOLN
10.0000 mg | INTRAVENOUS | Status: DC | PRN
  Administered 2014-11-29: 50 ug/min via INTRAVENOUS

## 2014-11-29 MED ORDER — PHENOL 1.4 % MT LIQD: 1.0000 | OROMUCOSAL | Status: DC | PRN

## 2014-11-29 MED ORDER — POTASSIUM CHLORIDE IN NACL 20-0.45 MEQ/L-% IV SOLN
INTRAVENOUS | Status: DC
  Administered 2014-11-30: 01:00:00 via INTRAVENOUS
  Filled 2014-11-29 (×5): qty 1000

## 2014-11-29 MED ORDER — SENNA-DOCUSATE SODIUM 8.6-50 MG PO TABS: 2.0000 | ORAL_TABLET | Freq: Every day | ORAL | Status: DC

## 2014-11-29 MED ORDER — DOCUSATE SODIUM 100 MG PO CAPS
100.0000 mg | ORAL_CAPSULE | Freq: Every day | ORAL | Status: DC
  Administered 2014-11-29 – 2014-11-30 (×2): 100 mg via ORAL
  Filled 2014-11-29 (×3): qty 1

## 2014-11-29 MED ORDER — BISACODYL 10 MG RE SUPP: 10.0000 mg | Freq: Every day | RECTAL | Status: DC | PRN

## 2014-11-29 MED ORDER — PREGABALIN 75 MG PO CAPS
75.0000 mg | ORAL_CAPSULE | Freq: Two times a day (BID) | ORAL | Status: DC
Start: 2014-11-29 — End: 2014-12-01
  Administered 2014-11-29 – 2014-12-01 (×4): 75 mg via ORAL
  Filled 2014-11-29 (×4): qty 1

## 2014-11-29 MED ORDER — CLOPIDOGREL BISULFATE 75 MG PO TABS
75.0000 mg | ORAL_TABLET | Freq: Every day | ORAL | Status: DC
  Administered 2014-11-30 – 2014-12-01 (×2): 75 mg via ORAL
  Filled 2014-11-29 (×3): qty 1

## 2014-11-29 MED ORDER — HYDROCODONE-ACETAMINOPHEN 10-325 MG PO TABS: 1.0000 | ORAL_TABLET | Freq: Four times a day (QID) | ORAL | Status: DC | PRN

## 2014-11-29 MED ORDER — FENTANYL CITRATE (PF) 100 MCG/2ML IJ SOLN
INTRAMUSCULAR | Status: DC | PRN
  Administered 2014-11-29: 50 ug via INTRAVENOUS

## 2014-11-29 MED ORDER — CEFAZOLIN SODIUM 1-5 GM-% IV SOLN
1.0000 g | Freq: Four times a day (QID) | INTRAVENOUS | Status: AC
  Administered 2014-11-29 – 2014-11-30 (×3): 1 g via INTRAVENOUS
  Filled 2014-11-29 (×3): qty 50

## 2014-11-29 MED ORDER — ACETAMINOPHEN 650 MG RE SUPP: 650.0000 mg | Freq: Four times a day (QID) | RECTAL | Status: DC | PRN

## 2014-11-29 MED ORDER — NEOSTIGMINE METHYLSULFATE 10 MG/10ML IV SOLN
INTRAVENOUS | Status: DC | PRN
  Administered 2014-11-29: 3 mg via INTRAVENOUS

## 2014-11-29 MED ORDER — ONDANSETRON HCL 4 MG/2ML IJ SOLN: 4.0000 mg | Freq: Four times a day (QID) | INTRAMUSCULAR | Status: DC | PRN

## 2014-11-29 MED ORDER — MIDAZOLAM HCL 2 MG/2ML IJ SOLN
INTRAMUSCULAR | Status: AC
  Filled 2014-11-29: qty 2

## 2014-11-29 MED ORDER — GLUCOSAMINE-CHONDROITIN 500-400 MG PO TABS: 1.0000 | ORAL_TABLET | Freq: Four times a day (QID) | ORAL | Status: DC

## 2014-11-29 MED ORDER — FENTANYL CITRATE (PF) 250 MCG/5ML IJ SOLN
INTRAMUSCULAR | Status: AC
  Filled 2014-11-29: qty 5

## 2014-11-29 MED ORDER — MIDAZOLAM HCL 2 MG/2ML IJ SOLN
INTRAMUSCULAR | Status: AC
  Administered 2014-11-29: 1 mg via INTRAVENOUS
  Filled 2014-11-29: qty 2

## 2014-11-29 MED ORDER — METOCLOPRAMIDE HCL 5 MG PO TABS
5.0000 mg | ORAL_TABLET | Freq: Three times a day (TID) | ORAL | Status: DC | PRN
Start: 2014-11-29 — End: 2014-12-01

## 2014-11-29 MED ORDER — SENNA 8.6 MG PO TABS
1.0000 | ORAL_TABLET | Freq: Two times a day (BID) | ORAL | Status: DC
  Administered 2014-11-29 – 2014-12-01 (×4): 8.6 mg via ORAL
  Filled 2014-11-29 (×4): qty 1

## 2014-11-29 MED ORDER — MENTHOL 3 MG MT LOZG: 1.0000 | LOZENGE | OROMUCOSAL | Status: DC | PRN

## 2014-11-29 MED ORDER — METOCLOPRAMIDE HCL 5 MG/ML IJ SOLN: 5.0000 mg | Freq: Three times a day (TID) | INTRAMUSCULAR | Status: DC | PRN

## 2014-11-29 MED ORDER — CALCIUM CARBONATE 200 MG PO CAPS: 250.0000 mg | ORAL_CAPSULE | Freq: Two times a day (BID) | ORAL | Status: DC

## 2014-11-29 MED ORDER — LIDOCAINE HCL (CARDIAC) 20 MG/ML IV SOLN
INTRAVENOUS | Status: DC | PRN
  Administered 2014-11-29: 100 mg via INTRAVENOUS

## 2014-11-29 MED ORDER — OMEGA-3-ACID ETHYL ESTERS 1 G PO CAPS
1.0000 g | ORAL_CAPSULE | Freq: Every day | ORAL | Status: DC
  Administered 2014-11-29 – 2014-12-01 (×3): 1 g via ORAL
  Filled 2014-11-29 (×3): qty 1

## 2014-11-29 MED ORDER — SODIUM CHLORIDE 0.9 % IR SOLN
Status: DC | PRN
  Administered 2014-11-29: 1000 mL

## 2014-11-29 MED ORDER — METHOCARBAMOL 1000 MG/10ML IJ SOLN
500.0000 mg | Freq: Four times a day (QID) | INTRAMUSCULAR | Status: DC | PRN
  Filled 2014-11-29: qty 5

## 2014-11-29 MED ORDER — FISH OIL 1200 MG PO CPDR: 1.0000 | DELAYED_RELEASE_CAPSULE | Freq: Every day | ORAL | Status: DC

## 2014-11-29 MED ORDER — LISINOPRIL 20 MG PO TABS
20.0000 mg | ORAL_TABLET | Freq: Every day | ORAL | Status: DC
  Administered 2014-11-29 – 2014-12-01 (×3): 20 mg via ORAL
  Filled 2014-11-29 (×3): qty 1

## 2014-11-29 MED ORDER — GLYCOPYRROLATE 0.2 MG/ML IJ SOLN
INTRAMUSCULAR | Status: DC | PRN
  Administered 2014-11-29: .6 mg via INTRAVENOUS
  Administered 2014-11-29: 0.2 mg via INTRAVENOUS

## 2014-11-29 MED ORDER — HYDROMORPHONE HCL 1 MG/ML IJ SOLN
0.2500 mg | INTRAMUSCULAR | Status: DC | PRN
  Administered 2014-11-29: 0.5 mg via INTRAVENOUS

## 2014-11-29 MED ORDER — FENTANYL CITRATE (PF) 100 MCG/2ML IJ SOLN
INTRAMUSCULAR | Status: DC
Start: 2014-11-29 — End: 2014-11-29
  Filled 2014-11-29: qty 2

## 2014-11-29 MED ORDER — GLYCOPYRROLATE 0.2 MG/ML IJ SOLN
INTRAMUSCULAR | Status: AC
  Filled 2014-11-29: qty 3

## 2014-11-29 MED ORDER — METOPROLOL SUCCINATE ER 25 MG PO TB24
25.0000 mg | ORAL_TABLET | Freq: Every day | ORAL | Status: DC
  Administered 2014-11-30 – 2014-12-01 (×2): 25 mg via ORAL
  Filled 2014-11-29 (×2): qty 1

## 2014-11-29 MED ORDER — ATORVASTATIN CALCIUM 40 MG PO TABS
40.0000 mg | ORAL_TABLET | Freq: Every day | ORAL | Status: DC
  Administered 2014-11-29 – 2014-12-01 (×3): 40 mg via ORAL
  Filled 2014-11-29 (×3): qty 1

## 2014-11-29 MED ORDER — POLYETHYLENE GLYCOL 3350 17 G PO PACK: 17.0000 g | PACK | Freq: Every day | ORAL | Status: DC | PRN

## 2014-11-29 MED ORDER — ONDANSETRON HCL 4 MG/2ML IJ SOLN
INTRAMUSCULAR | Status: DC | PRN
  Administered 2014-11-29: 4 mg via INTRAVENOUS

## 2014-11-29 MED ORDER — HYDROMORPHONE HCL 1 MG/ML IJ SOLN
INTRAMUSCULAR | Status: AC
  Filled 2014-11-29: qty 1

## 2014-11-29 MED ORDER — MAGNESIUM CITRATE PO SOLN: 1.0000 | Freq: Once | ORAL | Status: DC | PRN

## 2014-11-29 MED ORDER — BUPIVACAINE-EPINEPHRINE (PF) 0.5% -1:200000 IJ SOLN
INTRAMUSCULAR | Status: DC | PRN
  Administered 2014-11-29: 30 mL via PERINEURAL

## 2014-11-29 MED ORDER — ONDANSETRON HCL 4 MG PO TABS: 4.0000 mg | ORAL_TABLET | Freq: Four times a day (QID) | ORAL | Status: DC | PRN

## 2014-11-29 MED ORDER — LACTATED RINGERS IV SOLN
INTRAVENOUS | Status: DC
  Administered 2014-11-29: 09:00:00 via INTRAVENOUS

## 2014-11-29 MED ORDER — SENIOR MULTIVITAMIN PLUS PO TABS: 1.0000 | ORAL_TABLET | Freq: Every day | ORAL | Status: DC

## 2014-11-29 MED ORDER — NEOSTIGMINE METHYLSULFATE 10 MG/10ML IV SOLN
INTRAVENOUS | Status: AC
  Filled 2014-11-29: qty 1

## 2014-11-29 MED ORDER — LACTATED RINGERS IV SOLN
INTRAVENOUS | Status: DC | PRN
  Administered 2014-11-29 (×2): via INTRAVENOUS

## 2014-11-29 MED ORDER — HYDROMORPHONE HCL 1 MG/ML IJ SOLN
0.5000 mg | INTRAMUSCULAR | Status: DC | PRN
  Administered 2014-11-30 (×2): 0.5 mg via INTRAVENOUS
  Filled 2014-11-29 (×2): qty 1

## 2014-11-29 MED ORDER — VITAMIN D 1000 UNITS PO TABS
500.0000 [IU] | ORAL_TABLET | Freq: Every day | ORAL | Status: DC
  Administered 2014-11-29 – 2014-12-01 (×3): 500 [IU] via ORAL
  Filled 2014-11-29 (×3): qty 1

## 2014-11-29 MED ORDER — MEPERIDINE HCL 25 MG/ML IJ SOLN: 6.2500 mg | INTRAMUSCULAR | Status: DC | PRN

## 2014-11-29 MED ORDER — MIDAZOLAM HCL 2 MG/2ML IJ SOLN
1.0000 mg | Freq: Once | INTRAMUSCULAR | Status: AC
  Administered 2014-11-29: 1 mg via INTRAVENOUS
  Filled 2014-11-29: qty 1

## 2014-11-29 MED ORDER — ROCURONIUM BROMIDE 100 MG/10ML IV SOLN
INTRAVENOUS | Status: DC | PRN
  Administered 2014-11-29: 30 mg via INTRAVENOUS

## 2014-11-29 SURGICAL SUPPLY — 65 items
BIT DRILL 5/64X5 DISP (BIT) ×3 IMPLANT
BIT DRILL QUICK RELEASE PRPHRL (DRILL) ×3 IMPLANT
BLADE SAW SGTL MED 73X18.5 STR (BLADE) ×3 IMPLANT
BRUSH FEMORAL CANAL (MISCELLANEOUS) IMPLANT
CAPT SHLDR TOTAL 2 ×3 IMPLANT
CEMENT BONE DEPUY (Cement) ×3 IMPLANT
CLOSURE STERI-STRIP 1/2X4 (GAUZE/BANDAGES/DRESSINGS) ×1
CLSR STERI-STRIP ANTIMIC 1/2X4 (GAUZE/BANDAGES/DRESSINGS) ×2 IMPLANT
COVER SURGICAL LIGHT HANDLE (MISCELLANEOUS) ×3 IMPLANT
COVER TABLE BACK 60X90 (DRAPES) IMPLANT
DRAPE ORTHO SPLIT 77X108 STRL (DRAPES) ×4
DRAPE PROXIMA HALF (DRAPES) ×3 IMPLANT
DRAPE SURG ORHT 6 SPLT 77X108 (DRAPES) ×2 IMPLANT
DRAPE U-SHAPE 47X51 STRL (DRAPES) ×3 IMPLANT
DRILL QUICK RELEASE PERIPHERAL (DRILL) ×9
DRSG MEPILEX BORDER 4X8 (GAUZE/BANDAGES/DRESSINGS) ×3 IMPLANT
DURAPREP 26ML APPLICATOR (WOUND CARE) ×3 IMPLANT
ELECT REM PT RETURN 9FT ADLT (ELECTROSURGICAL) ×3
ELECTRODE REM PT RTRN 9FT ADLT (ELECTROSURGICAL) ×1 IMPLANT
EVACUATOR 1/8 PVC DRAIN (DRAIN) IMPLANT
FACESHIELD WRAPAROUND (MASK) ×3 IMPLANT
GLOVE BIOGEL PI IND STRL 8 (GLOVE) ×1 IMPLANT
GLOVE BIOGEL PI INDICATOR 8 (GLOVE) ×2
GLOVE BIOGEL PI ORTHO PRO SZ8 (GLOVE) ×2
GLOVE ORTHO TXT STRL SZ7.5 (GLOVE) ×3 IMPLANT
GLOVE PI ORTHO PRO STRL SZ8 (GLOVE) ×1 IMPLANT
GLOVE SURG ORTHO 8.0 STRL STRW (GLOVE) ×6 IMPLANT
GOWN STRL REUS W/ TWL LRG LVL3 (GOWN DISPOSABLE) ×1 IMPLANT
GOWN STRL REUS W/ TWL XL LVL3 (GOWN DISPOSABLE) ×1 IMPLANT
GOWN STRL REUS W/TWL 2XL LVL3 (GOWN DISPOSABLE) ×3 IMPLANT
GOWN STRL REUS W/TWL LRG LVL3 (GOWN DISPOSABLE) ×2
GOWN STRL REUS W/TWL XL LVL3 (GOWN DISPOSABLE) ×2
HANDPIECE INTERPULSE COAX TIP (DISPOSABLE) ×2
HOOD PEEL AWAY FACE SHEILD DIS (HOOD) ×3 IMPLANT
KIT BASIN OR (CUSTOM PROCEDURE TRAY) ×3 IMPLANT
KIT ROOM TURNOVER OR (KITS) ×3 IMPLANT
MANIFOLD NEPTUNE II (INSTRUMENTS) ×3 IMPLANT
NEEDLE 1/2 CIR CATGUT .05X1.09 (NEEDLE) IMPLANT
NEEDLE HYPO 25GX1X1/2 BEV (NEEDLE) IMPLANT
NS IRRIG 1000ML POUR BTL (IV SOLUTION) ×3 IMPLANT
PACK SHOULDER (CUSTOM PROCEDURE TRAY) ×3 IMPLANT
PAD ARMBOARD 7.5X6 YLW CONV (MISCELLANEOUS) ×6 IMPLANT
PIN HUMERAL STMN 3.2MMX9IN (INSTRUMENTS) ×3 IMPLANT
PIN STEINMANN THREADED TIP (PIN) ×3 IMPLANT
SET HNDPC FAN SPRY TIP SCT (DISPOSABLE) ×1 IMPLANT
SLING ARM IMMOBILIZER LRG (SOFTGOODS) ×3 IMPLANT
SLING ARM IMMOBILIZER MED (SOFTGOODS) IMPLANT
SMARTMIX MINI TOWER (MISCELLANEOUS) ×3
SPONGE LAP 18X18 X RAY DECT (DISPOSABLE) ×3 IMPLANT
SUCTION FRAZIER TIP 10 FR DISP (SUCTIONS) ×3 IMPLANT
SUPPORT WRAP ARM LG (MISCELLANEOUS) ×3 IMPLANT
SUT FIBERWIRE #2 38 REV NDL BL (SUTURE)
SUT MAXBRAID (SUTURE) ×18 IMPLANT
SUT MNCRL AB 4-0 PS2 18 (SUTURE) IMPLANT
SUT VIC AB 0 CT1 27 (SUTURE) ×2
SUT VIC AB 0 CT1 27XBRD ANBCTR (SUTURE) ×1 IMPLANT
SUT VIC AB 2-0 CT1 27 (SUTURE)
SUT VIC AB 2-0 CT1 TAPERPNT 27 (SUTURE) IMPLANT
SUT VIC AB 3-0 SH 8-18 (SUTURE) ×3 IMPLANT
SUTURE FIBERWR#2 38 REV NDL BL (SUTURE) IMPLANT
SYR CONTROL 10ML LL (SYRINGE) IMPLANT
TOWEL OR 17X24 6PK STRL BLUE (TOWEL DISPOSABLE) ×3 IMPLANT
TOWEL OR 17X26 10 PK STRL BLUE (TOWEL DISPOSABLE) ×3 IMPLANT
TOWER SMARTMIX MINI (MISCELLANEOUS) ×1 IMPLANT
WATER STERILE IRR 1000ML POUR (IV SOLUTION) ×3 IMPLANT

## 2014-11-29 NOTE — Anesthesia Preprocedure Evaluation (Addendum)
Anesthesia Evaluation  Patient identified by MRN, date of birth, ID band Patient awake    Reviewed: Allergy & Precautions, NPO status , Patient's Chart, lab work & pertinent test results  Airway Mallampati: II   Neck ROM: full    Dental   Pulmonary neg pulmonary ROS,    breath sounds clear to auscultation       Cardiovascular hypertension, Pt. on medications + Peripheral Vascular Disease  + dysrhythmias  Rhythm:regular Rate:Normal     Neuro/Psych CVA negative psych ROS   GI/Hepatic negative GI ROS, Neg liver ROS,   Endo/Other  negative endocrine ROS  Renal/GU negative Renal ROS     Musculoskeletal  (+) Arthritis ,   Abdominal   Peds  Hematology negative hematology ROS (+)   Anesthesia Other Findings   Reproductive/Obstetrics negative OB ROS                            Anesthesia Physical  Anesthesia Plan  ASA: II  Anesthesia Plan: General and Regional   Post-op Pain Management: MAC Combined w/ Regional for Post-op pain   Induction: Intravenous  Airway Management Planned: Oral ETT  Additional Equipment:   Intra-op Plan:   Post-operative Plan: Extubation in OR  Informed Consent: I have reviewed the patients History and Physical, chart, labs and discussed the procedure including the risks, benefits and alternatives for the proposed anesthesia with the patient or authorized representative who has indicated his/her understanding and acceptance.   Dental advisory given  Plan Discussed with: CRNA  Anesthesia Plan Comments:        Anesthesia Quick Evaluation                                  Anesthesia Evaluation  Patient identified by MRN, date of birth, ID band Patient awake    Reviewed: Allergy & Precautions, NPO status , Patient's Chart, lab work & pertinent test results  Airway Mallampati: II   Neck ROM: full    Dental   Pulmonary neg pulmonary ROS,   breath sounds clear to auscultation        Cardiovascular hypertension, Rhythm:regular Rate:Normal     Neuro/Psych    GI/Hepatic   Endo/Other    Renal/GU      Musculoskeletal  (+) Arthritis -,   Abdominal   Peds  Hematology   Anesthesia Other Findings   Reproductive/Obstetrics                             Anesthesia Physical Anesthesia Plan  ASA: II  Anesthesia Plan: General and Regional   Post-op Pain Management: MAC Combined w/ Regional for Post-op pain   Induction: Intravenous  Airway Management Planned: Oral ETT  Additional Equipment:   Intra-op Plan:   Post-operative Plan: Extubation in OR  Informed Consent: I have reviewed the patients History and Physical, chart, labs and discussed the procedure including the risks, benefits and alternatives for the proposed anesthesia with the patient or authorized representative who has indicated his/her understanding and acceptance.     Plan Discussed with: CRNA, Anesthesiologist and Surgeon  Anesthesia Plan Comments:         Anesthesia Quick Evaluation

## 2014-11-29 NOTE — Anesthesia Procedure Notes (Addendum)
Procedure Name: Intubation Date/Time: 11/29/2014 11:18 AM Performed by: Neldon Newport Pre-anesthesia Checklist: Suction available, Emergency Drugs available, Patient identified, Timeout performed and Patient being monitored Patient Re-evaluated:Patient Re-evaluated prior to inductionOxygen Delivery Method: Circle system utilized Preoxygenation: Pre-oxygenation with 100% oxygen Intubation Type: IV induction Ventilation: Mask ventilation without difficulty Laryngoscope Size: Mac and 3 Grade View: Grade I Tube type: Oral Tube size: 7.0 mm Number of attempts: 1 Placement Confirmation: positive ETCO2,  ETT inserted through vocal cords under direct vision and breath sounds checked- equal and bilateral Secured at: 21 cm Tube secured with: Tape Dental Injury: Teeth and Oropharynx as per pre-operative assessment    Anesthesia Regional Block:  Interscalene brachial plexus block  Pre-Anesthetic Checklist: ,, timeout performed, Correct Patient, Correct Site, Correct Laterality, Correct Procedure, Correct Position, site marked, Risks and benefits discussed,  Surgical consent,  Pre-op evaluation,  At surgeon's request and post-op pain management  Laterality: Right  Prep: chloraprep       Needles:  Injection technique: Single-shot  Needle Type: Stimiplex          Additional Needles:  Procedures: ultrasound guided (picture in chart) Interscalene brachial plexus block Narrative:   Performed by: Personally   Additional Notes: Patient tolerated the procedure well without complications

## 2014-11-29 NOTE — Progress Notes (Signed)
Utilization review completed.  

## 2014-11-29 NOTE — Transfer of Care (Signed)
Immediate Anesthesia Transfer of Care Note  Patient: Peggy [name]  Procedure(s) Performed: Procedure(s): TOTAL SHOULDER ARTHROPLASTY (Right)  Patient Location: PACU  Anesthesia Type:General  Level of Consciousness: awake, alert  and oriented  Airway & Oxygen Therapy: Patient Spontanous Breathing and Patient connected to nasal cannula oxygen  Post-op Assessment: Report given to RN, Post -op Vital signs reviewed and stable and Patient moving all extremities X 4  Post vital signs: Reviewed and stable  Last Vitals:  Filed Vitals:   11/29/14 0939  BP: 102/65  Pulse: 80  Temp:   Resp: 16    Complications: No apparent anesthesia complications

## 2014-11-29 NOTE — Anesthesia Postprocedure Evaluation (Signed)
Anesthesia Post Note  Patient: Peggy [name]  Procedure(s) Performed: Procedure(s) (LRB): TOTAL SHOULDER ARTHROPLASTY (Right)  Anesthesia type: General  Patient location: PACU  Post pain: Pain level controlled  Post assessment: Post-op Vital signs reviewed  Last Vitals: BP 127/56 mmHg  Pulse 91  Temp(Src) 36.7 C (Oral)  Resp 17  Ht 5' 7.5" (1.715 m)  Wt 208 lb 9.6 oz (94.62 kg)  BMI 32.17 kg/m2  SpO2 97%  LMP  (LMP Unknown)  Post vital signs: Reviewed  Level of consciousness: sedated  Complications: No apparent anesthesia complications

## 2014-11-29 NOTE — Discharge Instructions (Signed)

## 2014-11-29 NOTE — Op Note (Signed)
11/29/2014  1:11 PM  PATIENT:  Peggy [name]    PRE-OPERATIVE DIAGNOSIS:  Right shoulder primary localized osteoarthritis  POST-OPERATIVE DIAGNOSIS:  Same  PROCEDURE:  Right TOTAL SHOULDER ARTHROPLASTY  SURGEON:  Johnny Bridge, MD  PHYSICIAN ASSISTANT: Joya Gaskins, OPA-C, present and scrubbed throughout the case, critical for completion in a timely fashion, and for retraction, instrumentation, and closure.  ANESTHESIA:   General  PREOPERATIVE INDICATIONS:  Peggy [name] is a  80 y.o. female with a diagnosis of djd right shoulder who failed conservative measures and elected for surgical management.    The risks benefits and alternatives were discussed with the patient preoperatively including but not limited to the risks of infection, bleeding, nerve injury, cardiopulmonary complications, the need for revision surgery, dislocation, loosening, incomplete relief of pain, among others, and the patient was willing to proceed.   OPERATIVE IMPLANTS: Biomet size 11 mini press-fit humeral stem, size 46+18 Versa-dial humeral head, set in the E position with increased coverage posterior superiorly, with a medium cemented glenoid polyethylene 3 peg implant with a central regenerex noncemented post.   OPERATIVE FINDINGS: Advanced glenohumeral osteoarthritis involving the glenoid and the humeral head with substantial osteophyte formation inferiorly. She had an erosive osteoarthritis of the posterior humeral head, and she had anterior osteophyte formation of the glenoid, with some posterior wear.  Unique aspects of the case: The subscapularis was very flexible, almost redundant, the inferior humeral head osteophyte was fairly extensive. The bone quality was mediocre. I was slightly larger on my stem size than compared to the contralateral side. Also, the glenoid was fairly big, may have had more advanced wear with flattening, and I aimed the pin slightly posterior and also with some inclination  in order to correct for superior and posterior wear. I had a small amount of bony defect on the posterior humerus from the [facility] retractor, given the relatively weak bone.  The glenoid pegs were all within bone, including the central post.   OPERATIVE PROCEDURE: The patient was brought to the operating room and placed in the supine position. General anesthesia was administered. IV antibiotics were given.  The upper extremity was prepped and draped in usual sterile fashion. The patient was in a beachchair position with all bony prominences padded.   Time out was performed and a deltopectoral approach was carried out. The biceps tendon was tenodesed to the pectoralis tendon. The subscapularis was released, tagging it with a #2 MaxBraid, leaving a cuff of tendon for repair.   The inferior osteophyte was removed, and release of the capsule off of the humeral side was completed. The head was dislocated, and I reamed sequentially. I placed the humeral cutting guide at 30 of retroversion, and then pinned this into place, and made my humeral neck cut. This was at the appropriate level.   I then placed deep retractors and exposed the glenoid. I excised the labrum circumferentially, taking care to protect the axillary nerve inferiorly.   I then placed a guidewire into the center position, controlling appropriate version and inclination. I then reamed over the guidewire with the small reamer, and was satisfied with the preparation. I preserved the subchondral bone in order to maximize the strength and minimize the risk for subsequent subsidence.   I then drilled the central hole for the regenerex peg, and then placed the guide, and then drilled the 3 peripheral peg holes. I had excellent bony circumferential contact.   I then cleaned the glenoid, irrigated it copiously, and then dried it and  cemented the prosthesis into place. Excellent seating was achieved. I had full exposure. The cement cured, and then I  turned my attention to the humeral side.   I sequentially broached, up to the selected size, with the broach set at 30 of retroversion. I then placed the real stem. I trialed with multiple heads, and the above-named component was selected. Increased posterior coverage improved the coverage. The soft tissue tension was appropriate.   I then impacted the real humeral head into place, reduced the head, and irrigated copiously. Excellent stability and range of motion was achieved. I repaired the subscapularis with 4 #2 MaxBraid, as well as the rotator interval, and irrigated copiously once more. The subcutaneous tissue was closed with Vicryl including the deltopectoral fascia.   The skin was closed with Steri-Strips and sterile gauze was applied. She had a preoperative nerve block. She tolerated the procedure well and there were no complications.

## 2014-11-29 NOTE — H&P (Signed)
PREOPERATIVE H&P  Chief Complaint: djd right shoulder  HPI: Peggy [name] is a 80 y.o. female who presents for preoperative history and physical with a diagnosis of djd right shoulder. Symptoms are rated as moderate to severe, and have been worsening.  This is significantly impairing activities of daily living.  She has elected for surgical management.   She has failed injections, activity modification, anti-inflammatories, and assistive devices.  Preoperative X-rays demonstrate end stage degenerative changes with osteophyte formation, loss of joint space, subchondral sclerosis.  She had her left shoulder replaced last year and has done very well with that, despite having a postoperative stroke. She is miserable with the right shoulder, with severe pain, difficulty with moving and has elected to proceed with surgical intervention.  Past Medical History  Diagnosis Date  . Hypertension   . Arthritis   . Mitral valve prolapse     takes antiabiotic with procedures  . Osteoarthritis of left shoulder, primary localized 04/22/2014  . Neuropathy ([place])     in feet  . Aortic stenosis     trace AS 05/31/14 echo (Dr. Einar Gip)  . Hyperlipidemia   . Dysrhythmia   . Stroke ([place]) 04/23/2014    pt. states that she has no deficits.   . Urinary urgency   . Wears glasses    Past Surgical History  Procedure Laterality Date  . Joint replacement      bilat knees  . Back surgery      L5 removed 1986  . Carpal tunnel release Right 2014  . Total shoulder arthroplasty Left 04/22/2014    Procedure: LEFT TOTAL SHOULDER ARTHROPLASTY;  Surgeon: Marchia Bond, MD;  Location: [facility];  Service: Orthopedics;  Laterality: Left;  . Thrombectomy    . Angioplasty    . Colonoscopy     Social History   Social History  . Marital Status: Married    Spouse Name: N/A  . Number of Children: N/A  . Years of Education: N/A   Social History Main Topics  . Smoking status: Never Smoker   . Smokeless  tobacco: Never Used  . Alcohol Use: 0.0 oz/week    0 Standard drinks or equivalent per week     Comment: occasionally  . Drug Use: No  . Sexual Activity:    Partners: Male   Other Topics Concern  . None   Social History Narrative   Family History  Problem Relation Age of Onset  . Diabetes Mother   . Heart failure Father   . Cancer Mother   . Stroke Maternal Grandmother   . Cancer Maternal Grandfather     unknown  . Stroke Paternal Grandmother    No Known Allergies Prior to Admission medications   Medication Sig Start Date End Date Taking? Authorizing Provider  atorvastatin (LIPITOR) 40 MG tablet Take 40 mg by mouth daily. 05/02/14 05/02/15 Yes Historical Provider, MD  calcium carbonate 200 MG capsule Take 250 mg by mouth 2 (two) times daily with a meal.   Yes Historical Provider, MD  cholecalciferol (VITAMIN D) 1000 UNITS tablet Take 500 Units by mouth daily.   Yes Historical Provider, MD  clopidogrel (PLAVIX) 75 MG tablet Take 75 mg by mouth. 05/02/14 05/02/15 Yes Historical Provider, MD  docusate sodium (COLACE) 100 MG capsule Take 100 mg by mouth daily.   Yes Historical Provider, MD  glucosamine-chondroitin 500-400 MG tablet Take 1 tablet by mouth 4 (four) times daily.   Yes Historical Provider, MD  lisinopril (PRINIVIL,ZESTRIL) 20 MG  tablet Take 20 mg by mouth daily.   Yes Historical Provider, MD  metoprolol succinate (TOPROL-XL) 25 MG 24 hr tablet Take 25 mg by mouth daily.  04/19/14  Yes Historical Provider, MD  Multiple Vitamins-Minerals (SENIOR MULTIVITAMIN PLUS) TABS Take 1 tablet by mouth daily.    Yes Historical Provider, MD  Omega-3 Fatty Acids (FISH OIL) 1200 MG CPDR Take 1 capsule by mouth daily.    Yes Historical Provider, MD  pregabalin (LYRICA) 75 MG capsule Take 75 mg by mouth 2 (two) times daily.   Yes Historical Provider, MD     Positive ROS: All other systems have been reviewed and were otherwise negative with the exception of those mentioned in the HPI and as  above.  Physical Exam: General: Alert, no acute distress Cardiovascular: No pedal edema Respiratory: No cyanosis, no use of accessory musculature GI: No organomegaly, abdomen is soft and non-tender Skin: No lesions in the area of chief complaint Neurologic: Sensation intact distally Psychiatric: Patient is competent for consent with normal mood and affect Lymphatic: No axillary or cervical lymphadenopathy  MUSCULOSKELETAL: Right shoulder active motion is 0-80 with external rotation to 0 and sensation intact distally, all fingers flex extend and abduct.  Assessment: djd right shoulder   Plan: Plan for Procedure(s): TOTAL SHOULDER ARTHROPLASTY  The risks benefits and alternatives were discussed with the patient including but not limited to the risks of nonoperative treatment, versus surgical intervention including infection, bleeding, nerve injury,  blood clots, cardiopulmonary complications, morbidity, mortality, among others, and they were willing to proceed.   Johnny Bridge, MD Cell [phone]   11/29/2014 10:04 AM

## 2014-11-30 ENCOUNTER — Encounter (HOSPITAL_COMMUNITY): Payer: Self-pay | Admitting: Orthopedic Surgery

## 2014-11-30 LAB — BASIC METABOLIC PANEL
Anion gap: 6 (ref 5–15)
BUN: 11 mg/dL (ref 6–20)
CALCIUM: 8.6 mg/dL — AB (ref 8.9–10.3)
CO2: 30 mmol/L (ref 22–32)
CREATININE: 0.76 mg/dL (ref 0.44–1.00)
Chloride: 100 mmol/L — ABNORMAL LOW (ref 101–111)
Glucose, Bld: 123 mg/dL — ABNORMAL HIGH (ref 65–99)
Potassium: 4.1 mmol/L (ref 3.5–5.1)
SODIUM: 136 mmol/L (ref 135–145)

## 2014-11-30 LAB — CBC
HCT: 37.8 % (ref 36.0–46.0)
HEMOGLOBIN: 12.7 g/dL (ref 12.0–15.0)
MCH: 30 pg (ref 26.0–34.0)
MCHC: 33.6 g/dL (ref 30.0–36.0)
MCV: 89.2 fL (ref 78.0–100.0)
PLATELETS: 246 10*3/uL (ref 150–400)
RBC: 4.24 MIL/uL (ref 3.87–5.11)
RDW: 13.2 % (ref 11.5–15.5)
WBC: 10.4 10*3/uL (ref 4.0–10.5)

## 2014-11-30 NOTE — Evaluation (Signed)
Occupational Therapy Evaluation Patient Details Name: Peggy [name] MRN: [id number] DOB: Oct 21, 1934 Today's Date: 11/30/2014    History of Present Illness S/P shoulder replacement (right)   Clinical Impression   Pt reports she was independent with ADLs and mobility PTA. Currently pt is min guard for functional mobility and mod A overall for ADLs. Began shoulder and ADL education with pt and family; pt verbalized understanding. Pt planning to d/c home with 24/7 supervision from her husband. Pt would benefit from continued skilled OT services in order to maximize independence and safety with UB ADLs and RUE HEP.     Follow Up Recommendations  Supervision/Assistance - 24 hour (Follow up per MD)    Equipment Recommendations  None recommended by OT    Recommendations for Other Services PT consult     Precautions / Restrictions Precautions Precautions: Shoulder Type of Shoulder Precautions: Conservative protocol: NO PROM/AROM to shoulder. AROM elbow, wrist, hand OK. NO pendulums. Shoulder Interventions: Shoulder sling/immobilizer;At all times;Off for dressing/bathing/exercises Precaution Booklet Issued: Yes (comment) Precaution Comments: Reviewed precautions with pt and family Required Braces or Orthoses: Sling Restrictions Weight Bearing Restrictions: Yes RUE Weight Bearing: Non weight bearing      Mobility Bed Mobility Overal bed mobility: Needs Assistance Bed Mobility: Supine to Sit     Supine to sit: Min assist     General bed mobility comments: Min A to assist in boosting up from supine position.   Transfers Overall transfer level: Needs assistance Equipment used: None Transfers: Sit to/from Stand Sit to Stand: Min guard         General transfer comment: Min guard for safety. Sit <> stand from EOB x 1, toilet x 1    Balance Overall balance assessment: Needs assistance Sitting-balance support: Feet supported Sitting balance-Leahy Scale: Good      Standing balance support: No upper extremity supported Standing balance-Leahy Scale: Fair Standing balance comment: No LOB noted during functional mobility but pt noted to be slightly unsteady when ambulating to BR.                            ADL Overall ADL's : Needs assistance/impaired Eating/Feeding: Set up;Sitting   Grooming: Min guard;Standing;Wash/dry hands   Upper Body Bathing: Moderate assistance;Sitting   Lower Body Bathing: Moderate assistance;Sit to/from stand   Upper Body Dressing : Moderate assistance;Sitting   Lower Body Dressing: Moderate assistance;Sit to/from stand   Toilet Transfer: Min guard;Ambulation;Comfort height toilet   Toileting- Clothing Manipulation and Hygiene: Min guard;Sit to/from stand   Tub/ Shower Transfer: Min guard;Ambulation   Functional mobility during ADLs: Min guard General ADL Comments: Husband and son present for OT eval. Educated pt on sling management and wear schedule, positioning of R UE in bed or sitting, edema management techniques, UB bathing and dressing technique, home safety, need for close supervision during ADLs and mobility; pt and husband verbalize understanding. Educated pt and husband on RUE HEP; pt able to return demonstrate understanding. Educated on completing RUE exercises at least 3 x per day; pt verbalized understanding. Reviewed precautions: no shoulder ROM, NWB status; pt verbalized understanding.       Vision     Perception     Praxis      Pertinent Vitals/Pain Pain Assessment: Faces Faces Pain Scale: Hurts little more Pain Location: R shoulder Pain Descriptors / Indicators: Grimacing;Sore Pain Intervention(s): Limited activity within patient's tolerance;Monitored during session;Repositioned;Ice applied     Hand Dominance Right   Extremity/Trunk Assessment  Upper Extremity Assessment Upper Extremity Assessment: RUE deficits/detail RUE Deficits / Details: Elbow, wrist, hand ROM WFL. MMT WFL.  Unable to assess shoulder due to immobilization. RUE: Unable to fully assess due to pain;Unable to fully assess due to immobilization   Lower Extremity Assessment Lower Extremity Assessment: Overall WFL for tasks assessed   Cervical / Trunk Assessment Cervical / Trunk Assessment: Normal   Communication Communication Communication: No difficulties   Cognition Arousal/Alertness: Awake/alert Behavior During Therapy: WFL for tasks assessed/performed Overall Cognitive Status: Within Functional Limits for tasks assessed                     General Comments       Exercises Exercises: Shoulder     Shoulder Instructions Shoulder Instructions Donning/doffing shirt without moving shoulder: Moderate assistance;Caregiver independent with task (education completed ) Method for sponge bathing under operated UE: Moderate assistance;Caregiver independent with task (education completed ) Donning/doffing sling/immobilizer: Moderate assistance;Caregiver independent with task (education completed ) Correct positioning of sling/immobilizer: Minimal assistance;Caregiver independent with task (education completed ) ROM for elbow, wrist and digits of operated UE: Supervision/safety;Caregiver independent with task (education completed ) Sling wearing schedule (on at all times/off for ADL's): Supervision/safety;Caregiver independent with task (education completed ) Proper positioning of operated UE when showering: Minimal assistance;Caregiver independent with task (education completed ) Positioning of UE while sleeping: Minimal assistance;Caregiver independent with task (education completed )    Home Living Family/patient expects to be discharged to:: Private residence Living Arrangements: Spouse/significant other Available Help at Discharge: Family;Available 24 hours/day Type of Home: House Home Access: Stairs to enter [employer] of Steps: 1   Home Layout: One level     Bathroom  Shower/Tub: Occupational psychologist: Standard     Home Equipment: Grab bars - tub/shower;Shower seat - built in          Prior Functioning/Environment Level of Independence: Independent             OT Diagnosis: Generalized weakness;Acute pain   OT Problem List: Impaired balance (sitting and/or standing);Decreased safety awareness;Decreased knowledge of use of DME or AE;Decreased knowledge of precautions;Pain;Impaired UE functional use   OT Treatment/Interventions: Self-care/ADL training;Therapeutic exercise;DME and/or AE instruction;Patient/family education    OT Goals(Current goals can be found in the care plan section) Acute Rehab OT Goals Patient Stated Goal: to return to independence OT Goal Formulation: With patient Time For Goal Achievement: 12/14/14 Potential to Achieve Goals: Good ADL Goals Pt Will Perform Upper Body Bathing: with min assist;sitting Pt Will Perform Upper Body Dressing: with min assist;sitting Pt/caregiver will Perform Home Exercise Program: Increased ROM;Right Upper extremity;Independently;With written HEP provided  OT Frequency: Min 2X/week   Barriers to D/C:            Co-evaluation              End of Session Equipment Utilized During Treatment: Other (comment);Oxygen (sling)  Activity Tolerance: Patient tolerated treatment well Patient left: in chair;with call bell/phone within reach;with family/visitor present   Time: [id number] OT Time Calculation (min): 36 min Charges:  OT General Charges $OT Visit: 1 Procedure OT Evaluation $Initial OT Evaluation Tier I: 1 Procedure OT Treatments $Self Care/Home Management : 8-22 mins G-Codes:     Binnie Kand M.S., OTR/L Pager: [phone]  11/30/2014, 10:17 AM

## 2014-11-30 NOTE — Progress Notes (Addendum)
Patient ID: Peggy [name], female   DOB: 1934/03/24, 80 y.o.   MRN: [id number]     Subjective:  Patient reports pain as mild to moderate.  Patient doing well in bed and alert follows all commands Denies any CP or SOB.  Objective:   VITALS:   Filed Vitals:   11/29/14 1759 11/29/14 2112 11/30/14 0149 11/30/14 0550  BP: 118/88 127/56 134/94 111/92  Pulse:  91 91 87  Temp:  98.1 F (36.7 C) 98.2 F (36.8 C) 98.7 F (37.1 C)  TempSrc:  Oral Oral Oral  Resp:  17 18 17   Height:      Weight:      SpO2:  97% 96% 96%    ABD soft Sensation intact distally Dorsiflexion/Plantar flexion intact Incision: dressing C/D/I and scant drainage Good wrist and hand motion Facial contour and eye motion good speech clear   Lab Results  Component Value Date   WBC 10.4 11/30/2014   HGB 12.7 11/30/2014   HCT 37.8 11/30/2014   MCV 89.2 11/30/2014   PLT 246 11/30/2014   BMET    Component Value Date/Time   NA 136 11/30/2014 0509   K 4.1 11/30/2014 0509   CL 100* 11/30/2014 0509   CO2 30 11/30/2014 0509   GLUCOSE 123* 11/30/2014 0509   BUN 11 11/30/2014 0509   CREATININE 0.76 11/30/2014 0509   CALCIUM 8.6* 11/30/2014 0509   GFRNONAA >60 11/30/2014 0509   GFRAA >60 11/30/2014 0509     Assessment/[name]: 1 Day Post-Op   Principal Problem:   Primary localized osteoarthrosis of right shoulder Active Problems:   S/P shoulder replacement   Advance diet Up with therapy [name] for discharge tomorrow Sling at all times  Dry dressing PRN   Peggy [name], Peggy [name] 11/30/2014, 7:13 AM  Seen and agree with above.   Probable dc home tomorrow.  Peggy Bond, MD Cell [phone]

## 2014-11-30 NOTE — Evaluation (Signed)
Physical Therapy Evaluation Patient Details Name: Peggy [name] MRN: [id number] DOB: [birth year] Today's Date: 11/30/2014   History of Present Illness  S/P shoulder replacement (right)  Clinical Impression  Pt admitted with above diagnosis. Pt currently with functional limitations due to the deficits listed below (see PT Problem List). Pt with generalized weakness and balance deficits resulting in increased fall risk after surgery. Required min A for 150' ambulation, advised pt to use quad cane for mobility upon return home.  Pt will benefit from skilled PT to increase their independence and safety with mobility to allow discharge to the venue listed below.       Follow Up Recommendations Home health PT;Supervision for mobility/OOB    Equipment Recommendations  None recommended by PT    Recommendations for Other Services       Precautions / Restrictions Precautions Precautions: Shoulder Type of Shoulder Precautions: Conservative protocol: NO PROM/AROM to shoulder. AROM elbow, wrist, hand OK. NO pendulums. Shoulder Interventions: Shoulder sling/immobilizer;At all times;Off for dressing/bathing/exercises Precaution Booklet Issued: Yes (comment) Precaution Comments: Reviewed precautions with pt and family Required Braces or Orthoses: Sling Restrictions Weight Bearing Restrictions: Yes RUE Weight Bearing: Non weight bearing      Mobility  Bed Mobility Overal bed mobility: Needs Assistance Bed Mobility: Sit to Supine     Supine to sit: Supervision Sit to supine: Supervision   General bed mobility comments: pt able to scoot into bed and then lie back without physical assistance. Assist for arrangin pillows and blankets to prop right shoulder  Transfers Overall transfer level: Needs assistance Equipment used: None Transfers: Sit to/from Stand Sit to Stand: Min assist         General transfer comment: pt attempted to stand 4x from recliner and achieved partial  standing but could not get all the way up. Required min HHA to left hand to achieve standing. Pt reports that she can use her lift chair at home if needed  Ambulation/Gait Ambulation/Gait assistance: Min assist Ambulation Distance (Feet): 150 Feet Assistive device: None Gait Pattern/deviations: Step-through pattern;Trunk flexed;Drifts right/left Gait velocity: decreased Gait velocity interpretation: Below normal speed for age/gender General Gait Details: pt with unsteady gait which worsened with distance as knees began to buckle. Pt with more flexed posture with increased distance as well. Min A to steady. Advised pt to use quad cane for first 2 weeks and thereafter if needed for improved balance and posture  Stairs            Wheelchair Mobility    Modified Rankin (Stroke Patients Only)       Balance Overall balance assessment: Needs assistance;History of Falls Sitting-balance support: No upper extremity supported Sitting balance-Leahy Scale: Good     Standing balance support: No upper extremity supported Standing balance-Leahy Scale: Fair Standing balance comment: pt with neuropathy in bilateral feet so has had balance deficits which are exacerbated by decreased mobility acutely.                              Pertinent Vitals/Pain Pain Assessment: Faces Faces Pain Scale: Hurts little more Pain Location: right shoulder Pain Descriptors / Indicators: Sore Pain Intervention(s): Limited activity within patient's tolerance;Monitored during session         See general comments below for O2 sats  Home Living Family/patient expects to be discharged to:: Private residence Living Arrangements: Spouse/significant other Available Help at Discharge: Family;Available 24 hours/day Type of Home: House Home Access: Stairs to enter  Entrance Stairs-Number of Steps: 1 Home Layout: One level Home Equipment: Grab bars - tub/shower;Cane - quad;Shower seat - built  in Additional Comments: pt used cane for awhile after left TSA/ CVA but has not used recently    Prior Function Level of Independence: Independent               Hand Dominance   Dominant Hand: Right    Extremity/Trunk Assessment   Upper Extremity Assessment: Defer to OT evaluation RUE Deficits / Details: Elbow, wrist, hand ROM WFL. MMT WFL. Unable to assess shoulder due to immobilization. RUE: Unable to fully assess due to pain;Unable to fully assess due to immobilization       Lower Extremity Assessment: Generalized weakness;RLE deficits/detail;LLE deficits/detail RLE Deficits / Details: bilateral knees began to buckle after 100' of ambulation, generalized weakness noted with transfers as well.  LLE Deficits / Details: same as RLE  Cervical / Trunk Assessment: Kyphotic  Communication   Communication: No difficulties (mild slurring since CVA)  Cognition Arousal/Alertness: Awake/alert Behavior During Therapy: WFL for tasks assessed/performed Overall Cognitive Status: Within Functional Limits for tasks assessed                      General Comments General comments (skin integrity, edema, etc.): O2 sats dropped to 86% on RA with ambulation, rose to 94% with standing rest. Dropped to 90% last half of walk, returned to 98% on 2L O2.     Exercises Shoulder Exercises Elbow Flexion: AROM;Right;10 reps;Seated (full ROM ) Wrist Flexion: AROM;Right;10 reps;Seated (full ROM) Digit Composite Flexion: AROM;Right;10 reps;Seated (full ROM) Donning/doffing shirt without moving shoulder: Moderate assistance;Caregiver independent with task (education completed ) Method for sponge bathing under operated UE: Moderate assistance;Caregiver independent with task (education completed ) Donning/doffing sling/immobilizer: Moderate assistance;Caregiver independent with task (education completed ) Correct positioning of sling/immobilizer: Minimal assistance;Caregiver independent with task  (education completed ) ROM for elbow, wrist and digits of operated UE: Supervision/safety;Caregiver independent with task (education completed ) Sling wearing schedule (on at all times/off for ADL's): Supervision/safety;Caregiver independent with task (education completed ) Proper positioning of operated UE when showering: Minimal assistance;Caregiver independent with task (education completed ) Positioning of UE while sleeping: Minimal assistance;Caregiver independent with task (education completed )      Assessment/Plan    PT Assessment Patient needs continued PT services  PT Diagnosis Abnormality of gait;Generalized weakness;Acute pain;Difficulty walking   PT Problem List Decreased strength;Decreased balance;Decreased activity tolerance;Decreased mobility;Decreased coordination;Decreased knowledge of use of DME;Decreased knowledge of precautions;Pain;Impaired sensation;Cardiopulmonary status limiting activity  PT Treatment Interventions DME instruction;Gait training;Stair training;Functional mobility training;Therapeutic activities;Therapeutic exercise;Balance training;Patient/family education   PT Goals (Current goals can be found in the Care Plan section) Acute Rehab PT Goals Patient Stated Goal: to return to independence PT Goal Formulation: With patient Time For Goal Achievement: 12/07/14 Potential to Achieve Goals: Good    Frequency Min 3X/week   Barriers to discharge        Co-evaluation               End of Session Equipment Utilized During Treatment: Gait belt;Other (comment) (sling) Activity Tolerance: Patient tolerated treatment well Patient left: in bed;with call bell/phone within reach;with family/visitor present Nurse Communication: Mobility status         Time: [id number] PT Time Calculation (min) (ACUTE ONLY): 24 min   Charges:   PT Evaluation $Initial PT Evaluation Tier I: 1 Procedure PT Treatments $Gait Training: 8-22 mins   PT G Codes:  Leighton Roach, PT  Acute Rehab Services  [place], [place] 11/30/2014, 11:34 AM

## 2014-12-01 NOTE — Progress Notes (Signed)
Physical Therapy Treatment Patient Details Name: Peggy [name] MRN: [id number] DOB: [birth year] Today's Date: 12/01/2014    History of Present Illness S/P shoulder replacement (right)    PT Comments    Patient is making good progress with PT.  From a mobility standpoint anticipate patient will be ready for DC home with spouse to assist. Patient and spouse deny any questions or concerns following session.       Follow Up Recommendations  Home health PT;Supervision for mobility/OOB     Equipment Recommendations  None recommended by PT    Recommendations for Other Services       Precautions / Restrictions Precautions Precautions: Shoulder Type of Shoulder Precautions: Conservative protocol: NO PROM/AROM to shoulder. AROM elbow, wrist, hand OK. NO pendulums. Shoulder Interventions: Shoulder sling/immobilizer;At all times;Off for dressing/bathing/exercises Required Braces or Orthoses: Sling Restrictions Weight Bearing Restrictions: Yes RUE Weight Bearing: Non weight bearing    Mobility  Bed Mobility Overal bed mobility: Needs Assistance Bed Mobility: Sit to Supine       Sit to supine: Supervision   General bed mobility comments: Up in chair upon arrival. Patient and spouse confirm feeling confident with getting in/out of bed.   Transfers Overall transfer level: Needs assistance Equipment used: None Transfers: Sit to/from Stand Sit to Stand: Supervision         General transfer comment: supervision for safety  Ambulation/Gait Ambulation/Gait assistance: Min guard Ambulation Distance (Feet): 200 Feet Assistive device: Straight cane Gait Pattern/deviations: Step-through pattern Gait velocity: decreased   General Gait Details: no loss of balance during session, patient reports feeling that she is at baseline while using cane. States she is planning to use her own cane at home.    Stairs Stairs:  (declined stairs, states she does not have any to enter  home.)          Wheelchair Mobility    Modified Rankin (Stroke Patients Only)       Balance Overall balance assessment: Needs assistance Sitting-balance support: No upper extremity supported Sitting balance-Leahy Scale: Good     Standing balance support: During functional activity Standing balance-Leahy Scale: Fair Standing balance comment: using SPC, no LOB during ambulation                    Cognition Arousal/Alertness: Awake/alert Behavior During Therapy: WFL for tasks assessed/performed Overall Cognitive Status: Within Functional Limits for tasks assessed                      Exercises      General Comments        Pertinent Vitals/Pain Pain Assessment: 0-10 Pain Score: 2  Pain Location: Rt shoulder Pain Descriptors / Indicators: Sore Pain Intervention(s): Limited activity within patient's tolerance;Monitored during session;Ice applied    Home Living                      Prior Function            PT Goals (current goals can now be found in the care plan section) Acute Rehab PT Goals Patient Stated Goal: to go home today PT Goal Formulation: With patient Time For Goal Achievement: 12/07/14 Potential to Achieve Goals: Good Progress towards PT goals: Progressing toward goals    Frequency  Min 3X/week    PT Plan Current plan remains appropriate    Co-evaluation             End of Session Equipment Utilized During Treatment: Gait  belt;Other (comment) (sling) Activity Tolerance: Patient tolerated treatment well Patient left: with call bell/phone within reach;in bed;with family/visitor present     Time: [id number] PT Time Calculation (min) (ACUTE ONLY): 18 min  Charges:  $Gait Training: 8-22 mins                    G Codes:      Cassell Clement, PT, CSCS Pager [phone] Office [phone]  12/01/2014, 4:23 PM

## 2014-12-01 NOTE — Progress Notes (Signed)
Patient ID: Peggy [name], female   DOB: 02-22-1934, 80 y.o.   MRN: [id number]     Subjective:  Patient reports pain as mild.  Patient in bed and in no acute distress.  Denies any CP or SOB  Objective:   VITALS:   Filed Vitals:   11/30/14 1027 11/30/14 1500 11/30/14 1952 12/01/14 0600  BP: 121/92 119/94 96/49 111/51  Pulse:  85 89 88  Temp:  98.5 F (36.9 C) 99 F (37.2 C) 98 F (36.7 C)  TempSrc:  Oral Oral Oral  Resp:  18 16 16   Height:      Weight:      SpO2:  96% 93% 92%    ABD soft Sensation intact distally Dorsiflexion/Plantar flexion intact Incision: dressing C/D/I and no drainage Good hand and wrist motion  Lab Results  Component Value Date   WBC 10.4 11/30/2014   HGB 12.7 11/30/2014   HCT 37.8 11/30/2014   MCV 89.2 11/30/2014   PLT 246 11/30/2014   BMET    Component Value Date/Time   NA 136 11/30/2014 0509   K 4.1 11/30/2014 0509   CL 100* 11/30/2014 0509   CO2 30 11/30/2014 0509   GLUCOSE 123* 11/30/2014 0509   BUN 11 11/30/2014 0509   CREATININE 0.76 11/30/2014 0509   CALCIUM 8.6* 11/30/2014 0509   GFRNONAA >60 11/30/2014 0509   GFRAA >60 11/30/2014 0509     Assessment/[name]: 2 Days Post-Op   Principal Problem:   Primary localized osteoarthrosis of right shoulder Active Problems:   S/P shoulder replacement   Advance diet Up with therapy Discharge home  Sling at all times NWB right upper ext.   Remonia Richter 12/01/2014, 7:12 AM   Marchia Bond, MD Cell [phone]

## 2014-12-01 NOTE — Progress Notes (Signed)
Occupational Therapy Treatment Patient Details Name: Peggy [name] MRN: [id number] DOB: 09-Jul-1934 Today's Date: 12/01/2014    History of present illness S/P shoulder replacement (right)   OT comments  Pt making great progress toward OT goals. Pt able to complete UB/LB dressing with min assist overall. Pt able to teach back all shoulder and ADL education. Pt tolerated AROM to elbow, wrist, and hand; reports she has been completing wrist and hand ROM on her own. Pt ready to d/c home from an OT standpoint. Will continue to follow acutely.     Follow Up Recommendations  Supervision/Assistance - 24 hour;Other (comment) (follow up per MD)    Equipment Recommendations  None recommended by OT    Recommendations for Other Services      Precautions / Restrictions Precautions Precautions: Shoulder Type of Shoulder Precautions: Conservative protocol: NO PROM/AROM to shoulder. AROM elbow, wrist, hand OK. NO pendulums. Shoulder Interventions: Shoulder sling/immobilizer;At all times;Off for dressing/bathing/exercises Precaution Comments: Reviewed precautions with pt Required Braces or Orthoses: Sling Restrictions Weight Bearing Restrictions: Yes RUE Weight Bearing: Non weight bearing       Mobility Bed Mobility Overal bed mobility: Needs Assistance Bed Mobility: Supine to Sit;Sit to Supine     Supine to sit: Min guard Sit to supine: Min guard      Transfers Overall transfer level: Needs assistance Equipment used: None Transfers: Sit to/from Stand Sit to Stand: Supervision         General transfer comment: Supervision for safety. Sit <> stand from EOB x 3    Balance Overall balance assessment: Needs assistance Sitting-balance support: No upper extremity supported;Feet supported Sitting balance-Leahy Scale: Good     Standing balance support: No upper extremity supported Standing balance-Leahy Scale: Fair                     ADL Overall ADL's : Needs  assistance/impaired         Upper Body Bathing: Minimal assitance;Sitting Upper Body Bathing Details (indicate cue type and reason): Pt able to teach back UB bathing technique.     Upper Body Dressing : Minimal assistance;Sitting Upper Body Dressing Details (indicate cue type and reason): Pt able to teach back UB dressing technique. Lower Body Dressing: Minimal assistance;Sit to/from stand Lower Body Dressing Details (indicate cue type and reason): Min assist to pull pants up in standing               General ADL Comments: Family present for OT session, stepped out for ADL. Reviewed all precautions and techniques with pt; pt able to return demonstrate and teach back. Pt reports completing wrist and hand ROM exercises on her own; difficulty with elbow since sling was on. Educated on importance of completing elbow exercises as well; pt verbalized understanding.      Vision                     Perception     Praxis      Cognition   Behavior During Therapy: [facility] for tasks assessed/performed Overall Cognitive Status: Within Functional Limits for tasks assessed                       Extremity/Trunk Assessment               Exercises Shoulder Exercises Elbow Flexion: AROM;Right;10 reps;Seated Wrist Flexion: AROM;Right;10 reps;Seated Digit Composite Flexion: AROM;Right;10 reps;Seated Donning/doffing shirt without moving shoulder: Minimal assistance;Caregiver independent with task;Patient able to independently direct  caregiver Method for sponge bathing under operated UE: Minimal assistance;Caregiver independent with task;Patient able to independently direct caregiver Donning/doffing sling/immobilizer: Moderate assistance;Caregiver independent with task;Patient able to independently direct caregiver Correct positioning of sling/immobilizer: Minimal assistance;Caregiver independent with task;Patient able to independently direct caregiver ROM for elbow, wrist  and digits of operated UE: Supervision/safety;Caregiver independent with task;Patient able to independently direct caregiver Sling wearing schedule (on at all times/off for ADL's): Modified independent Proper positioning of operated UE when showering: Minimal assistance;Caregiver independent with task;Patient able to independently direct caregiver Positioning of UE while sleeping: Minimal assistance;Caregiver independent with task;Patient able to independently direct caregiver   Shoulder Instructions Shoulder Instructions Donning/doffing shirt without moving shoulder: Minimal assistance;Caregiver independent with task;Patient able to independently direct caregiver Method for sponge bathing under operated UE: Minimal assistance;Caregiver independent with task;Patient able to independently direct caregiver Donning/doffing sling/immobilizer: Moderate assistance;Caregiver independent with task;Patient able to independently direct caregiver Correct positioning of sling/immobilizer: Minimal assistance;Caregiver independent with task;Patient able to independently direct caregiver ROM for elbow, wrist and digits of operated UE: Supervision/safety;Caregiver independent with task;Patient able to independently direct caregiver Sling wearing schedule (on at all times/off for ADL's): Modified independent Proper positioning of operated UE when showering: Minimal assistance;Caregiver independent with task;Patient able to independently direct caregiver Positioning of UE while sleeping: Minimal assistance;Caregiver independent with task;Patient able to independently direct caregiver     General Comments      Pertinent Vitals/ Pain       Pain Assessment: No/denies pain  Home Living                                          Prior Functioning/Environment              Frequency Min 2X/week     Progress Toward Goals  OT Goals(current goals can now be found in the care plan section)   Progress towards OT goals: Progressing toward goals  Acute Rehab OT Goals Patient Stated Goal: to go home today OT Goal Formulation: With patient  Plan Discharge plan remains appropriate    Co-evaluation                 End of Session Equipment Utilized During Treatment: Other (comment) (sling)   Activity Tolerance Patient tolerated treatment well   Patient Left in bed;with call bell/phone within reach;with family/visitor present   Nurse Communication          Time: 0912-0929 OT Time Calculation (min): 17 min  Charges: OT General Charges $OT Visit: 1 Procedure OT Treatments $Self Care/Home Management : 8-22 mins  Binnie Kand M.S., OTR/L Pager: [phone]  12/01/2014, 9:37 AM

## 2014-12-02 NOTE — Discharge Summary (Addendum)
Physician Discharge Summary  Patient ID: Peggy [name] MRN: [id number] DOB/AGE: 04-16-1934 80 y.o.  Admit date: 11/29/2014 Discharge date: 12/01/2014  Admission Diagnoses:  Primary localized osteoarthrosis of right shoulder  Discharge Diagnoses:  Principal Problem:   Primary localized osteoarthrosis of right shoulder Active Problems:   S/P shoulder replacement   Past Medical History  Diagnosis Date  . Hypertension   . Arthritis   . Mitral valve prolapse     takes antiabiotic with procedures  . Osteoarthritis of left shoulder, primary localized 04/22/2014  . Neuropathy ([place])     in feet  . Aortic stenosis     trace AS 05/31/14 echo (Dr. Einar Gip)  . Hyperlipidemia   . Dysrhythmia   . Stroke ([place]) 04/23/2014    pt. states that she has no deficits.   . Urinary urgency   . Wears glasses   . Primary localized osteoarthrosis of right shoulder 11/29/2014    Surgeries: Procedure(s): TOTAL SHOULDER ARTHROPLASTY on 11/29/2014   Consultants (if any):    Discharged Condition: Improved  Hospital Course: Peggy [name] is an 80 y.o. female who was admitted 11/29/2014 with a diagnosis of Primary localized osteoarthrosis of right shoulder and went to the operating room on 11/29/2014 and underwent the above named procedures.    She was given perioperative antibiotics:  Anti-infectives    Start     Dose/Rate Route Frequency Ordered Stop   11/29/14 1700  ceFAZolin (ANCEF) IVPB 1 g/50 mL premix     1 g 100 mL/hr over 30 Minutes Intravenous Every 6 hours 11/29/14 1655 11/30/14 0534   11/29/14 0600  ceFAZolin (ANCEF) IVPB 2 g/50 mL premix     2 g 100 mL/hr over 30 Minutes Intravenous On call to O.R. 11/28/14 1340 11/29/14 1128    .  She was given sequential compression devices, early ambulation, and resumption of plavix for DVT prophylaxis.  She benefited maximally from the hospital stay and there were no complications.    Recent vital signs:  Filed Vitals:   12/01/14 0600   BP: 111/51  Pulse: 88  Temp: 98 F (36.7 C)  Resp: 16    Recent laboratory studies:  Lab Results  Component Value Date   HGB 12.7 11/30/2014   HGB 14.9 11/23/2014   HGB 14.7 10/28/2014   Lab Results  Component Value Date   WBC 10.4 11/30/2014   PLT 246 11/30/2014   Lab Results  Component Value Date   INR 1.05 04/23/2014   Lab Results  Component Value Date   NA 136 11/30/2014   K 4.1 11/30/2014   CL 100* 11/30/2014   CO2 30 11/30/2014   BUN 11 11/30/2014   CREATININE 0.76 11/30/2014   GLUCOSE 123* 11/30/2014    Discharge Medications:     Medication List    TAKE these medications        atorvastatin 40 MG tablet  Commonly known as:  LIPITOR  Take 40 mg by mouth daily.     baclofen 10 MG tablet  Commonly known as:  LIORESAL  Take 1 tablet (10 mg total) by mouth 3 (three) times daily. As needed for muscle spasm     calcium carbonate 200 MG capsule  Take 250 mg by mouth 2 (two) times daily with a meal.     cholecalciferol 1000 UNITS tablet  Commonly known as:  VITAMIN D  Take 500 Units by mouth daily.     clopidogrel 75 MG tablet  Commonly known as:  PLAVIX  Take 75  mg by mouth.     docusate sodium 100 MG capsule  Commonly known as:  COLACE  Take 100 mg by mouth daily.     Fish Oil 1200 MG Cpdr  Take 1 capsule by mouth daily.     glucosamine-chondroitin 500-400 MG tablet  Take 1 tablet by mouth 4 (four) times daily.     HYDROcodone-acetaminophen 10-325 MG tablet  Commonly known as:  NORCO  Take 1-2 tablets by mouth every 6 (six) hours as needed.     lisinopril 20 MG tablet  Commonly known as:  PRINIVIL,ZESTRIL  Take 20 mg by mouth daily.     metoprolol succinate 25 MG 24 hr tablet  Commonly known as:  TOPROL-XL  Take 25 mg by mouth daily.     pregabalin 75 MG capsule  Commonly known as:  LYRICA  Take 75 mg by mouth 2 (two) times daily.     SENIOR MULTIVITAMIN PLUS Tabs  Take 1 tablet by mouth daily.     sennosides-docusate sodium  8.6-50 MG tablet  Commonly known as:  SENOKOT-S  Take 2 tablets by mouth daily.        Diagnostic Studies: Dg Shoulder Right Port  December 29, 2014  CLINICAL DATA:  Postoperative radiograph. EXAM: PORTABLE RIGHT SHOULDER - 2+ VIEW COMPARISON:  CT of the shoulder dated 07/06/2014. FINDINGS: There has been interval right shoulder arthroplasty. The prosthetic humerus is normally positioned within the glenoid. Expected postsurgical changes are seen. Radiodense oblong body is seen overlying the glenohumeral joint with uncertain significance. IMPRESSION: Postoperative radiograph from right humeral joint replacement with satisfactory positioning. Electronically Signed   By: Fidela Salisbury M.D.   On: 12/29/2014 14:48    Disposition: 01-Home or Self Care        Follow-up Information    Follow up with Johnny Bridge, MD. Schedule an appointment as soon as possible for a visit in 2 weeks.   Specialty:  Orthopedic Surgery   Contact information:   [place] [address] [phone]        Signed: Johnny Bridge 12/02/2014, 7:23 AM

## 2014-12-13 DIAGNOSIS — M19011 Primary osteoarthritis, right shoulder: Secondary | ICD-10-CM | POA: Diagnosis not present

## 2014-12-16 DIAGNOSIS — L309 Dermatitis, unspecified: Secondary | ICD-10-CM | POA: Diagnosis not present

## 2014-12-23 DIAGNOSIS — L309 Dermatitis, unspecified: Secondary | ICD-10-CM | POA: Diagnosis not present

## 2015-01-11 DIAGNOSIS — M19011 Primary osteoarthritis, right shoulder: Secondary | ICD-10-CM | POA: Diagnosis not present

## 2015-01-19 DIAGNOSIS — M25511 Pain in right shoulder: Secondary | ICD-10-CM | POA: Diagnosis not present

## 2015-01-19 DIAGNOSIS — M25611 Stiffness of right shoulder, not elsewhere classified: Secondary | ICD-10-CM | POA: Diagnosis not present

## 2015-01-19 DIAGNOSIS — Z471 Aftercare following joint replacement surgery: Secondary | ICD-10-CM | POA: Diagnosis not present

## 2015-01-19 DIAGNOSIS — Z96611 Presence of right artificial shoulder joint: Secondary | ICD-10-CM | POA: Diagnosis not present

## 2015-01-24 DIAGNOSIS — Z471 Aftercare following joint replacement surgery: Secondary | ICD-10-CM | POA: Diagnosis not present

## 2015-01-24 DIAGNOSIS — Z96611 Presence of right artificial shoulder joint: Secondary | ICD-10-CM | POA: Diagnosis not present

## 2015-01-24 DIAGNOSIS — M25511 Pain in right shoulder: Secondary | ICD-10-CM | POA: Diagnosis not present

## 2015-01-24 DIAGNOSIS — M25611 Stiffness of right shoulder, not elsewhere classified: Secondary | ICD-10-CM | POA: Diagnosis not present

## 2015-01-27 DIAGNOSIS — Z471 Aftercare following joint replacement surgery: Secondary | ICD-10-CM | POA: Diagnosis not present

## 2015-01-27 DIAGNOSIS — Z96611 Presence of right artificial shoulder joint: Secondary | ICD-10-CM | POA: Diagnosis not present

## 2015-01-27 DIAGNOSIS — M25511 Pain in right shoulder: Secondary | ICD-10-CM | POA: Diagnosis not present

## 2015-01-27 DIAGNOSIS — M25611 Stiffness of right shoulder, not elsewhere classified: Secondary | ICD-10-CM | POA: Diagnosis not present

## 2015-01-31 DIAGNOSIS — Z471 Aftercare following joint replacement surgery: Secondary | ICD-10-CM | POA: Diagnosis not present

## 2015-01-31 DIAGNOSIS — Z96611 Presence of right artificial shoulder joint: Secondary | ICD-10-CM | POA: Diagnosis not present

## 2015-01-31 DIAGNOSIS — M25511 Pain in right shoulder: Secondary | ICD-10-CM | POA: Diagnosis not present

## 2015-01-31 DIAGNOSIS — M25611 Stiffness of right shoulder, not elsewhere classified: Secondary | ICD-10-CM | POA: Diagnosis not present

## 2015-02-08 DIAGNOSIS — Z96611 Presence of right artificial shoulder joint: Secondary | ICD-10-CM | POA: Diagnosis not present

## 2015-03-21 ENCOUNTER — Encounter: Payer: Self-pay | Admitting: Neurology

## 2015-03-21 ENCOUNTER — Ambulatory Visit (INDEPENDENT_AMBULATORY_CARE_PROVIDER_SITE_OTHER): Payer: Medicare Other | Admitting: Neurology

## 2015-03-21 VITALS — BP 128/80 | HR 91 | Ht 68.0 in | Wt 210.0 lb

## 2015-03-21 DIAGNOSIS — I6521 Occlusion and stenosis of right carotid artery: Secondary | ICD-10-CM | POA: Diagnosis not present

## 2015-03-21 DIAGNOSIS — I63311 Cerebral infarction due to thrombosis of right middle cerebral artery: Secondary | ICD-10-CM | POA: Diagnosis not present

## 2015-03-21 DIAGNOSIS — I10 Essential (primary) hypertension: Secondary | ICD-10-CM | POA: Insufficient documentation

## 2015-03-21 NOTE — Progress Notes (Signed)
Note routed

## 2015-03-21 NOTE — Patient Instructions (Signed)
1.  Continue Plavix 75mg  daily 2.  Continue Lipitor 3.  Continue blood pressure control 4.  Mediterranean diet    Why follow it? Research shows. . Those who follow the Mediterranean diet have a reduced risk of heart disease  . The diet is associated with a reduced incidence of Parkinson's and Alzheimer's diseases . People following the diet may have longer life expectancies and lower rates of chronic diseases  . The Dietary Guidelines for Americans recommends the Mediterranean diet as an eating plan to promote health and prevent disease  What Is the Mediterranean Diet?  . Healthy eating plan based on typical foods and recipes of Mediterranean-style cooking . The diet is primarily a plant based diet; these foods should make up a majority of meals   Starches - Plant based foods should make up a majority of meals - They are an important sources of vitamins, minerals, energy, antioxidants, and fiber - Choose whole grains, foods high in fiber and minimally processed items  - Typical grain sources include wheat, oats, barley, corn, brown rice, bulgar, farro, millet, polenta, couscous  - Various types of beans include chickpeas, lentils, fava beans, black beans, white beans   Fruits  Veggies - Large quantities of antioxidant rich fruits & veggies; 6 or more servings  - Vegetables can be eaten raw or lightly drizzled with oil and cooked  - Vegetables common to the traditional Mediterranean Diet include: artichokes, arugula, beets, broccoli, brussel sprouts, cabbage, carrots, celery, collard greens, cucumbers, eggplant, kale, leeks, lemons, lettuce, mushrooms, okra, onions, peas, peppers, potatoes, pumpkin, radishes, rutabaga, shallots, spinach, sweet potatoes, turnips, zucchini - Fruits common to the Mediterranean Diet include: apples, apricots, avocados, cherries, clementines, dates, figs, grapefruits, grapes, melons, nectarines, oranges, peaches, pears, pomegranates, strawberries, tangerines  Fats -  Replace butter and margarine with healthy oils, such as olive oil, canola oil, and tahini  - Limit nuts to no more than a handful a day  - Nuts include walnuts, almonds, pecans, pistachios, pine nuts  - Limit or avoid candied, honey roasted or heavily salted nuts - Olives are central to the [employer] - can be eaten whole or used in a variety of dishes   Meats Protein - Limiting red meat: no more than a few times a month - When eating red meat: choose lean cuts and keep the portion to the size of deck of cards - Eggs: approx. 0 to 4 times a week  - Fish and lean poultry: at least 2 a week  - Healthy protein sources include, chicken, [place], lean beef, lamb - Increase intake of seafood such as tuna, salmon, trout, mackerel, shrimp, scallops - Avoid or limit high fat processed meats such as sausage and bacon  Dairy - Include moderate amounts of low fat dairy products  - Focus on healthy dairy such as fat free yogurt, skim milk, low or reduced fat cheese - Limit dairy products higher in fat such as whole or 2% milk, cheese, ice cream  Alcohol - Moderate amounts of red wine is ok  - No more than 5 oz daily for women (all ages) and men older than age 34  - No more than 10 oz of wine daily for men younger than 18  Other - Limit sweets and other desserts  - Use herbs and spices instead of salt to flavor foods  - Herbs and spices common to the traditional Mediterranean Diet include: basil, bay leaves, chives, cloves, cumin, fennel, garlic, lavender, marjoram, mint, oregano, parsley,  pepper, rosemary, sage, savory, sumac, tarragon, thyme   It's not just a diet, it's a lifestyle:  . The Mediterranean diet includes lifestyle factors typical of those in the region  . Foods, drinks and meals are best eaten with others and savored . Daily physical activity is important for overall good health . This could be strenuous exercise like running and aerobics . This could also be more leisurely  activities such as walking, housework, yard-work, or taking the stairs . Moderation is the key; a balanced and healthy diet accommodates most foods and drinks . Consider portion sizes and frequency of consumption of certain foods   Meal Ideas & Options:  . Breakfast:  o Whole wheat toast or whole wheat English muffins with peanut butter & hard boiled egg o Steel cut oats topped with apples & cinnamon and skim milk  o Fresh fruit: banana, strawberries, melon, berries, peaches  o Smoothies: strawberries, bananas, greek yogurt, peanut butter o Low fat greek yogurt with blueberries and granola  o Egg white omelet with spinach and mushrooms o Breakfast couscous: whole wheat couscous, apricots, skim milk, cranberries  . Sandwiches:  o Hummus and grilled vegetables (peppers, zucchini, squash) on whole wheat bread   o Grilled chicken on whole wheat pita with lettuce, tomatoes, cucumbers or tzatziki  o Tuna salad on whole wheat bread: tuna salad made with greek yogurt, olives, red peppers, capers, green onions o Garlic rosemary lamb pita: lamb sauted with garlic, rosemary, salt & pepper; add lettuce, cucumber, greek yogurt to pita - flavor with lemon juice and black pepper  . Seafood:  o Mediterranean grilled salmon, seasoned with garlic, basil, parsley, lemon juice and black pepper o Shrimp, lemon, and spinach whole-grain pasta salad made with low fat greek yogurt  o Seared scallops with lemon orzo  o Seared tuna steaks seasoned salt, pepper, coriander topped with tomato mixture of olives, tomatoes, olive oil, minced garlic, parsley, green onions and cappers  . Meats:  o Herbed greek chicken salad with kalamata olives, cucumber, feta  o Red bell peppers stuffed with spinach, bulgur, lean ground beef (or lentils) & topped with feta   o Kebabs: skewers of chicken, tomatoes, onions, zucchini, squash  o [place] burgers: made with red onions, mint, dill, lemon juice, feta cheese topped with roasted red  peppers . Vegetarian o Cucumber salad: cucumbers, artichoke hearts, celery, red onion, feta cheese, tossed in olive oil & lemon juice  o Hummus and whole grain pita points with a greek salad (lettuce, tomato, feta, olives, cucumbers, red onion) o Lentil soup with celery, carrots made with vegetable broth, garlic, salt and pepper  o Tabouli salad: parsley, bulgur, mint, scallions, cucumbers, tomato, radishes, lemon juice, olive oil, salt and pepper. 5.  Follow up in one year

## 2015-03-21 NOTE — Progress Notes (Signed)
NEUROLOGY FOLLOW UP OFFICE NOTE  Peggy [name] [id number]  HISTORY OF PRESENT ILLNESS: Peggy [name] is a 81 year old right-handed woman with hypertension, arthritis and mitral valve prolapse who follows up for stroke.  She is accompanied by her husband who provides some history.  Labs and CTA report reviewed.  UPDATE: She underwent right shoulder surgery a few months ago.  She is followed by her neurovascular surgeon, Dr. Caryl Comes.  Recent CTA of the neck showed moderate in-stent stenosis at 75%. She has been doing well. Hgb A1c from November was 5.9%.  HISTORY: On 04/22/14, patient underwent left shoulder replacement.  The next day, she collapsed.  She had left facial weakness, as well as left arm and leg weakness.  She was not a tPA candidate due to the recent surgery.  She was transferred to [facility].  CTA of the head and neck showed right MCA occlusion with reconstitution of flow within its branches.  As well as severe proximal right ICA stenosis.  MRI of brain confirmed acute small right MCA territory infarcts.  Echo showed EF of 65-70% with no cardiac source of embolus.  LDL was 63, triglycerides 113 and Hgb A1c 5.4.  She underwent right MCA thrombectomy and right internal carotid angioplasty and stenting.  During her hospitalization, she developed  hypotension and was started on Florinef.    She takes Plavix and Lipitor.  PAST MEDICAL HISTORY: Past Medical History  Diagnosis Date  . Hypertension   . Arthritis   . Mitral valve prolapse     takes antiabiotic with procedures  . Osteoarthritis of left shoulder, primary localized 04/22/2014  . Neuropathy ([place])     in feet  . Aortic stenosis     trace AS 05/31/14 echo (Dr. Einar Gip)  . Hyperlipidemia   . Dysrhythmia   . Stroke ([place]) 04/23/2014    pt. states that she has no deficits.   . Urinary urgency   . Wears glasses   . Primary localized osteoarthrosis of right shoulder 11/29/2014     MEDICATIONS: Current Outpatient Prescriptions on File Prior to Visit  Medication Sig Dispense Refill  . atorvastatin (LIPITOR) 40 MG tablet Take 40 mg by mouth daily.    . baclofen (LIORESAL) 10 MG tablet Take 1 tablet (10 mg total) by mouth 3 (three) times daily. As needed for muscle spasm 50 tablet 0  . calcium carbonate 200 MG capsule Take 250 mg by mouth 2 (two) times daily with a meal.    . cholecalciferol (VITAMIN D) 1000 UNITS tablet Take 500 Units by mouth daily.    . clopidogrel (PLAVIX) 75 MG tablet Take 75 mg by mouth.    . docusate sodium (COLACE) 100 MG capsule Take 100 mg by mouth daily.    Marland Kitchen glucosamine-chondroitin 500-400 MG tablet Take 1 tablet by mouth 4 (four) times daily.    Marland Kitchen lisinopril (PRINIVIL,ZESTRIL) 20 MG tablet Take 20 mg by mouth daily.    . metoprolol succinate (TOPROL-XL) 25 MG 24 hr tablet Take 25 mg by mouth daily.     . Multiple Vitamins-Minerals (SENIOR MULTIVITAMIN PLUS) TABS Take 1 tablet by mouth daily.     . Omega-3 Fatty Acids (FISH OIL) 1200 MG CPDR Take 1 capsule by mouth daily.     . pregabalin (LYRICA) 75 MG capsule Take 75 mg by mouth 2 (two) times daily.    . sennosides-docusate sodium (SENOKOT-S) 8.6-50 MG tablet Take 2 tablets by mouth daily. 30 tablet 1   No  current facility-administered medications on file prior to visit.    ALLERGIES: No Known Allergies  FAMILY HISTORY: Family History  Problem Relation Age of Onset  . Diabetes Mother   . Heart failure Father   . Cancer Mother   . Stroke Maternal Grandmother   . Cancer Maternal Grandfather     unknown  . Stroke Paternal Grandmother     SOCIAL HISTORY: Social History   Social History  . Marital Status: Married    Spouse Name: N/A  . Number of Children: N/A  . Years of Education: N/A   Occupational History  . Not on file.   Social History Main Topics  . Smoking status: Never Smoker   . Smokeless tobacco: Never Used  . Alcohol Use: 0.0 oz/week    0 Standard drinks  or equivalent per week     Comment: occasionally  . Drug Use: No  . Sexual Activity:    Partners: Male   Other Topics Concern  . Not on file   Social History Narrative    REVIEW OF SYSTEMS: Constitutional: No fevers, chills, or sweats, no generalized fatigue, change in appetite Eyes: No visual changes, double vision, eye pain Ear, nose and throat: No hearing loss, ear pain, nasal congestion, sore throat Cardiovascular: No chest pain, palpitations Respiratory:  No shortness of breath at rest or with exertion, wheezes GastrointestinaI: No nausea, vomiting, diarrhea, abdominal pain, fecal incontinence Genitourinary:  No dysuria, urinary retention or frequency Musculoskeletal:  No neck pain, back pain Integumentary: No rash, pruritus, skin lesions Neurological: as above Psychiatric: No depression, insomnia, anxiety Endocrine: No palpitations, fatigue, diaphoresis, mood swings, change in appetite, change in weight, increased thirst Hematologic/Lymphatic:  No anemia, purpura, petechiae. Allergic/Immunologic: no itchy/runny eyes, nasal congestion, recent allergic reactions, rashes  PHYSICAL EXAM: Filed Vitals:   03/21/15 1300  BP: 128/80  Pulse: 91   General: No acute distress.  Patient appears well-groomed.  Head:  Normocephalic/atraumatic Eyes:  Fundoscopic exam unremarkable without vessel changes, exudates, hemorrhages or papilledema. Neck: supple, no paraspinal tenderness, full range of motion Heart:  Regular rate and rhythm Lungs:  Clear to auscultation bilaterally Back: No paraspinal tenderness Neurological Exam: alert and oriented to person, place, and time. Attention span and concentration intact, recent and remote memory intact, fund of knowledge intact.  Speech fluent and not dysarthric, language intact.  Mild left facial weakness.  Otherwise, CN II-XII intact. Fundoscopic exam unremarkable without vessel changes, exudates, hemorrhages or papilledema.  Bulk and tone  normal, muscle strength 5/5 throughout.  Sensation to pinprick and vibration reduced in feet.  Deep tendon reflexes trace in upper extremities and absent in lower extremities, toes downgoing.  Finger to nose and heel to shin testing intact.  Gait wide-based, Romberg with sway.  IMPRESSION: Right MCA stroke, possibly thrombo-embolic from right carotid artery. Right carotid stenosis status post stent Hypertension Hyperlipidemia  PLAN: Plavix 75mg  daily Lipitor (LDL at goal of less than 70) Blood pressure and glycemic control Mediterranean diet Carotid arteries followed by Dr. Harvel Ricks Follow up in one year.  15 minutes spent face to face with patient, over 50% spent discussing management.  Metta Clines, DO  CC:  Aura Dials

## 2015-03-29 DIAGNOSIS — D3131 Benign neoplasm of right choroid: Secondary | ICD-10-CM | POA: Diagnosis not present

## 2015-03-29 DIAGNOSIS — H2513 Age-related nuclear cataract, bilateral: Secondary | ICD-10-CM | POA: Diagnosis not present

## 2015-06-09 DIAGNOSIS — I10 Essential (primary) hypertension: Secondary | ICD-10-CM | POA: Diagnosis not present

## 2015-06-09 DIAGNOSIS — I350 Nonrheumatic aortic (valve) stenosis: Secondary | ICD-10-CM | POA: Diagnosis not present

## 2015-06-09 DIAGNOSIS — Z8673 Personal history of transient ischemic attack (TIA), and cerebral infarction without residual deficits: Secondary | ICD-10-CM | POA: Diagnosis not present

## 2015-06-20 DIAGNOSIS — L821 Other seborrheic keratosis: Secondary | ICD-10-CM | POA: Diagnosis not present

## 2015-06-20 DIAGNOSIS — D1800 Hemangioma unspecified site: Secondary | ICD-10-CM | POA: Diagnosis not present

## 2015-06-20 DIAGNOSIS — D225 Melanocytic nevi of trunk: Secondary | ICD-10-CM | POA: Diagnosis not present

## 2015-06-20 DIAGNOSIS — L723 Sebaceous cyst: Secondary | ICD-10-CM | POA: Diagnosis not present

## 2015-07-26 DIAGNOSIS — G609 Hereditary and idiopathic neuropathy, unspecified: Secondary | ICD-10-CM | POA: Diagnosis not present

## 2015-07-26 DIAGNOSIS — I10 Essential (primary) hypertension: Secondary | ICD-10-CM | POA: Diagnosis not present

## 2015-07-26 DIAGNOSIS — R7303 Prediabetes: Secondary | ICD-10-CM | POA: Diagnosis not present

## 2015-07-26 DIAGNOSIS — Z8673 Personal history of transient ischemic attack (TIA), and cerebral infarction without residual deficits: Secondary | ICD-10-CM | POA: Diagnosis not present

## 2015-07-26 DIAGNOSIS — Z79899 Other long term (current) drug therapy: Secondary | ICD-10-CM | POA: Diagnosis not present

## 2015-07-26 DIAGNOSIS — E785 Hyperlipidemia, unspecified: Secondary | ICD-10-CM | POA: Diagnosis not present

## 2015-07-26 DIAGNOSIS — Z7902 Long term (current) use of antithrombotics/antiplatelets: Secondary | ICD-10-CM | POA: Diagnosis not present

## 2015-07-26 DIAGNOSIS — Z23 Encounter for immunization: Secondary | ICD-10-CM | POA: Diagnosis not present

## 2015-07-26 DIAGNOSIS — I779 Disorder of arteries and arterioles, unspecified: Secondary | ICD-10-CM | POA: Diagnosis not present

## 2015-07-27 DIAGNOSIS — Z7902 Long term (current) use of antithrombotics/antiplatelets: Secondary | ICD-10-CM | POA: Diagnosis not present

## 2015-07-27 DIAGNOSIS — Z79899 Other long term (current) drug therapy: Secondary | ICD-10-CM | POA: Diagnosis not present

## 2015-07-27 DIAGNOSIS — Z8673 Personal history of transient ischemic attack (TIA), and cerebral infarction without residual deficits: Secondary | ICD-10-CM | POA: Diagnosis not present

## 2015-07-27 DIAGNOSIS — E785 Hyperlipidemia, unspecified: Secondary | ICD-10-CM | POA: Diagnosis not present

## 2015-07-27 DIAGNOSIS — R7301 Impaired fasting glucose: Secondary | ICD-10-CM | POA: Diagnosis not present

## 2015-07-27 DIAGNOSIS — I10 Essential (primary) hypertension: Secondary | ICD-10-CM | POA: Diagnosis not present

## 2015-10-03 DIAGNOSIS — I63311 Cerebral infarction due to thrombosis of right middle cerebral artery: Secondary | ICD-10-CM | POA: Diagnosis not present

## 2015-10-03 DIAGNOSIS — Z23 Encounter for immunization: Secondary | ICD-10-CM | POA: Diagnosis not present

## 2015-10-03 DIAGNOSIS — R7301 Impaired fasting glucose: Secondary | ICD-10-CM | POA: Diagnosis not present

## 2015-10-03 DIAGNOSIS — I779 Disorder of arteries and arterioles, unspecified: Secondary | ICD-10-CM | POA: Diagnosis not present

## 2015-10-03 DIAGNOSIS — I350 Nonrheumatic aortic (valve) stenosis: Secondary | ICD-10-CM | POA: Diagnosis not present

## 2015-10-03 DIAGNOSIS — E785 Hyperlipidemia, unspecified: Secondary | ICD-10-CM | POA: Diagnosis not present

## 2015-10-03 DIAGNOSIS — Z0000 Encounter for general adult medical examination without abnormal findings: Secondary | ICD-10-CM | POA: Diagnosis not present

## 2015-10-03 DIAGNOSIS — G609 Hereditary and idiopathic neuropathy, unspecified: Secondary | ICD-10-CM | POA: Diagnosis not present

## 2015-10-03 DIAGNOSIS — I10 Essential (primary) hypertension: Secondary | ICD-10-CM | POA: Diagnosis not present

## 2015-10-03 DIAGNOSIS — N3941 Urge incontinence: Secondary | ICD-10-CM | POA: Diagnosis not present

## 2015-10-03 DIAGNOSIS — H259 Unspecified age-related cataract: Secondary | ICD-10-CM | POA: Diagnosis not present

## 2015-10-05 DIAGNOSIS — D3131 Benign neoplasm of right choroid: Secondary | ICD-10-CM | POA: Diagnosis not present

## 2015-10-05 DIAGNOSIS — H2513 Age-related nuclear cataract, bilateral: Secondary | ICD-10-CM | POA: Diagnosis not present

## 2015-10-05 DIAGNOSIS — H40013 Open angle with borderline findings, low risk, bilateral: Secondary | ICD-10-CM | POA: Diagnosis not present

## 2015-11-13 DIAGNOSIS — I6521 Occlusion and stenosis of right carotid artery: Secondary | ICD-10-CM | POA: Diagnosis not present

## 2015-11-13 DIAGNOSIS — I10 Essential (primary) hypertension: Secondary | ICD-10-CM | POA: Diagnosis not present

## 2015-11-13 DIAGNOSIS — I6529 Occlusion and stenosis of unspecified carotid artery: Secondary | ICD-10-CM | POA: Diagnosis not present

## 2015-11-13 DIAGNOSIS — Z8673 Personal history of transient ischemic attack (TIA), and cerebral infarction without residual deficits: Secondary | ICD-10-CM | POA: Diagnosis not present

## 2015-11-13 DIAGNOSIS — G9389 Other specified disorders of brain: Secondary | ICD-10-CM | POA: Diagnosis not present

## 2015-11-17 DIAGNOSIS — I6509 Occlusion and stenosis of unspecified vertebral artery: Secondary | ICD-10-CM | POA: Diagnosis not present

## 2015-11-17 DIAGNOSIS — I6502 Occlusion and stenosis of left vertebral artery: Secondary | ICD-10-CM | POA: Diagnosis not present

## 2015-11-17 DIAGNOSIS — I6523 Occlusion and stenosis of bilateral carotid arteries: Secondary | ICD-10-CM | POA: Diagnosis not present

## 2015-11-17 DIAGNOSIS — I6501 Occlusion and stenosis of right vertebral artery: Secondary | ICD-10-CM | POA: Diagnosis not present

## 2015-11-17 DIAGNOSIS — I6522 Occlusion and stenosis of left carotid artery: Secondary | ICD-10-CM | POA: Diagnosis not present

## 2015-12-27 DIAGNOSIS — M25512 Pain in left shoulder: Secondary | ICD-10-CM | POA: Diagnosis not present

## 2015-12-27 DIAGNOSIS — Z96611 Presence of right artificial shoulder joint: Secondary | ICD-10-CM | POA: Diagnosis not present

## 2016-01-04 DIAGNOSIS — Z8673 Personal history of transient ischemic attack (TIA), and cerebral infarction without residual deficits: Secondary | ICD-10-CM | POA: Diagnosis not present

## 2016-01-04 DIAGNOSIS — I10 Essential (primary) hypertension: Secondary | ICD-10-CM | POA: Diagnosis not present

## 2016-01-04 DIAGNOSIS — I350 Nonrheumatic aortic (valve) stenosis: Secondary | ICD-10-CM | POA: Diagnosis not present

## 2016-03-20 ENCOUNTER — Ambulatory Visit (INDEPENDENT_AMBULATORY_CARE_PROVIDER_SITE_OTHER): Payer: Medicare Other | Admitting: Neurology

## 2016-03-20 ENCOUNTER — Encounter: Payer: Self-pay | Admitting: Neurology

## 2016-03-20 VITALS — BP 130/78 | HR 86 | Ht 68.0 in | Wt 203.2 lb

## 2016-03-20 DIAGNOSIS — I10 Essential (primary) hypertension: Secondary | ICD-10-CM | POA: Diagnosis not present

## 2016-03-20 DIAGNOSIS — E785 Hyperlipidemia, unspecified: Secondary | ICD-10-CM

## 2016-03-20 DIAGNOSIS — I63311 Cerebral infarction due to thrombosis of right middle cerebral artery: Secondary | ICD-10-CM

## 2016-03-20 DIAGNOSIS — I6521 Occlusion and stenosis of right carotid artery: Secondary | ICD-10-CM | POA: Diagnosis not present

## 2016-03-20 NOTE — Progress Notes (Signed)
NEUROLOGY FOLLOW UP OFFICE NOTE  Peggy [name] [id number]  HISTORY OF PRESENT ILLNESS: Peggy [name] is a 82 year old right-handed woman with hypertension, arthritis and mitral valve prolapse who follows up for stroke.  She is accompanied by her husband who supplements history.     UPDATE: She underwent right shoulder surgery a few months ago.  She is followed by her neurovascular surgeon, Dr. Caryl Comes.  CTA of the head and neck from 11/13/15 and 11/17/15 respectively showed no significant intracranial stenosis or occlusion and patent right carotid stent with some residual stenosis due to compression of the mid stent by dense calcium, as well as 70% stenosis of the left ICA origin.  She is following a Mediterranean diet.  She walks the dog everyday with her husband.  She uses the cane outside of the house.   HISTORY: On 04/22/14, patient underwent left shoulder replacement.  The next day, she collapsed.  She had left facial weakness, as well as left arm and leg weakness.  She was not a tPA candidate due to the recent surgery.  She was transferred to [facility].  CTA of the head and neck showed right MCA occlusion with reconstitution of flow within its branches, as well as severe proximal right ICA stenosis.  MRI of brain confirmed acute small right MCA territory infarcts.  Echo showed EF of 65-70% with no cardiac source of embolus.  LDL was 63, triglycerides 113 and Hgb A1c 5.4.  She underwent right MCA thrombectomy and right internal carotid angioplasty and stenting.  CTA of the neck from 10/20/14 showed moderate in-stent stenosis at 75%.  PAST MEDICAL HISTORY: Past Medical History:  Diagnosis Date  . Aortic stenosis    trace AS 05/31/14 echo (Dr. Einar Gip)  . Arthritis   . Dysrhythmia   . Hyperlipidemia   . Hypertension   . Mitral valve prolapse    takes antiabiotic with procedures  . Neuropathy ([place])    in feet  . Osteoarthritis of left shoulder, primary localized  04/22/2014  . Primary localized osteoarthrosis of right shoulder 11/29/2014  . Stroke ([place]) 04/23/2014   pt. states that she has no deficits.   . Urinary urgency   . Wears glasses     MEDICATIONS: Current Outpatient Prescriptions on File Prior to Visit  Medication Sig Dispense Refill  . calcium carbonate 200 MG capsule Take 250 mg by mouth 2 (two) times daily with a meal.    . cholecalciferol (VITAMIN D) 1000 UNITS tablet Take 500 Units by mouth daily.    Marland Kitchen docusate sodium (COLACE) 100 MG capsule Take 100 mg by mouth daily.    Marland Kitchen glucosamine-chondroitin 500-400 MG tablet Take 1 tablet by mouth 4 (four) times daily.    Marland Kitchen lisinopril (PRINIVIL,ZESTRIL) 20 MG tablet Take 20 mg by mouth daily.    . Multiple Vitamins-Minerals (SENIOR MULTIVITAMIN PLUS) TABS Take 1 tablet by mouth daily.     . Omega-3 Fatty Acids (FISH OIL) 1200 MG CPDR Take 1 capsule by mouth daily.     . pregabalin (LYRICA) 75 MG capsule Take 75 mg by mouth 2 (two) times daily.    Marland Kitchen atorvastatin (LIPITOR) 40 MG tablet Take 40 mg by mouth daily.     No current facility-administered medications on file prior to visit.     ALLERGIES: No Known Allergies  FAMILY HISTORY: Family History  Problem Relation Age of Onset  . Diabetes Mother   . Cancer Mother   . Heart failure Father   .  Stroke Maternal Grandmother   . Cancer Maternal Grandfather     unknown  . Stroke Paternal Grandmother     SOCIAL HISTORY: Social History   Social History  . Marital status: Married    Spouse name: N/A  . Number of children: N/A  . Years of education: N/A   Occupational History  . Not on file.   Social History Main Topics  . Smoking status: Never Smoker  . Smokeless tobacco: Never Used  . Alcohol use 0.0 oz/week     Comment: occasionally  . Drug use: No  . Sexual activity: Yes    Partners: Male   Other Topics Concern  . Not on file   Social History Narrative  . No narrative on file    REVIEW OF SYSTEMS: Constitutional:  No fevers, chills, or sweats, no generalized fatigue, change in appetite Eyes: No visual changes, double vision, eye pain Ear, nose and throat: No hearing loss, ear pain, nasal congestion, sore throat Cardiovascular: No chest pain, palpitations Respiratory:  No shortness of breath at rest or with exertion, wheezes GastrointestinaI: No nausea, vomiting, diarrhea, abdominal pain, fecal incontinence Genitourinary:  No dysuria, urinary retention or frequency Musculoskeletal:  No neck pain, back pain Integumentary: No rash, pruritus, skin lesions Neurological: as above Psychiatric: No depression, insomnia, anxiety Endocrine: No palpitations, fatigue, diaphoresis, mood swings, change in appetite, change in weight, increased thirst Hematologic/Lymphatic:  No purpura, petechiae. Allergic/Immunologic: no itchy/runny eyes, nasal congestion, recent allergic reactions, rashes  PHYSICAL EXAM: Vitals:   03/20/16 1338  BP: 130/78  Pulse: 86   General: No acute distress.  Patient appears well-groomed.  normal body habitus. Head:  Normocephalic/atraumatic Eyes:  Fundi examined but not visualized Neck: supple, no paraspinal tenderness, full range of motion Heart:  Regular rate and rhythm Lungs:  Clear to auscultation bilaterally Back: No paraspinal tenderness Neurological Exam: alert and oriented to person, place, and time. Attention span and concentration intact, recent and remote memory intact, fund of knowledge intact. Speech fluent and not dysarthric, language intact. Trace left facial weakness. Otherwise, CN II-XII intact. Bulk and tone normal, muscle strength 5/5 throughout. Sensation to pinprick and vibration reduced in feet. Deep tendon reflexes trace in upper extremities and absent in lower extremities, toes downgoing. Finger to nose testing intact. Gait wide-based, Romberg with sway.  IMPRESSION: Right MCA stroke, possibly thrombo-embolic from right carotid artery. Right carotid  stenosis status post stent Hypertension Hyperlipidemia  PLAN: Plavix 75mg  daily Lipitor 40mg  daily as per PCP (LDL at goal of less than 70).  Blood pressure and glycemic control Continue Mediterranean diet Carotid arteries followed by Dr. Harvel Ricks Follow up in one year.  18 minutes spent face to face with patient, over 50% spent discussing status update and recent tests as well as recommendations.  Metta Clines, DO  CC:  Aura Dials, MD

## 2016-03-20 NOTE — Patient Instructions (Signed)
1.  Continue the Plavix for secondary stroke prevention 2.  Continue the Lipitor for cholesterol 3.  Continue blood pressure control 4.  Continue Mediterranean diet 5.  Follow up in one year

## 2016-03-26 DIAGNOSIS — N3941 Urge incontinence: Secondary | ICD-10-CM | POA: Diagnosis not present

## 2016-03-26 DIAGNOSIS — G609 Hereditary and idiopathic neuropathy, unspecified: Secondary | ICD-10-CM | POA: Diagnosis not present

## 2016-03-26 DIAGNOSIS — R739 Hyperglycemia, unspecified: Secondary | ICD-10-CM | POA: Diagnosis not present

## 2016-03-26 DIAGNOSIS — I10 Essential (primary) hypertension: Secondary | ICD-10-CM | POA: Diagnosis not present

## 2016-03-26 DIAGNOSIS — I63311 Cerebral infarction due to thrombosis of right middle cerebral artery: Secondary | ICD-10-CM | POA: Diagnosis not present

## 2016-03-26 DIAGNOSIS — E785 Hyperlipidemia, unspecified: Secondary | ICD-10-CM | POA: Diagnosis not present

## 2016-04-03 DIAGNOSIS — H2513 Age-related nuclear cataract, bilateral: Secondary | ICD-10-CM | POA: Diagnosis not present

## 2016-04-03 DIAGNOSIS — H40013 Open angle with borderline findings, low risk, bilateral: Secondary | ICD-10-CM | POA: Diagnosis not present

## 2016-06-20 DIAGNOSIS — B009 Herpesviral infection, unspecified: Secondary | ICD-10-CM | POA: Diagnosis not present

## 2016-06-20 DIAGNOSIS — L821 Other seborrheic keratosis: Secondary | ICD-10-CM | POA: Diagnosis not present

## 2016-06-20 DIAGNOSIS — L723 Sebaceous cyst: Secondary | ICD-10-CM | POA: Diagnosis not present

## 2016-06-20 DIAGNOSIS — L2084 Intrinsic (allergic) eczema: Secondary | ICD-10-CM | POA: Diagnosis not present

## 2016-06-20 DIAGNOSIS — D225 Melanocytic nevi of trunk: Secondary | ICD-10-CM | POA: Diagnosis not present

## 2016-06-20 DIAGNOSIS — D1800 Hemangioma unspecified site: Secondary | ICD-10-CM | POA: Diagnosis not present

## 2016-08-27 DIAGNOSIS — R351 Nocturia: Secondary | ICD-10-CM | POA: Diagnosis not present

## 2016-08-27 DIAGNOSIS — R350 Frequency of micturition: Secondary | ICD-10-CM | POA: Diagnosis not present

## 2016-08-27 DIAGNOSIS — N3944 Nocturnal enuresis: Secondary | ICD-10-CM | POA: Diagnosis not present

## 2016-08-27 DIAGNOSIS — N3946 Mixed incontinence: Secondary | ICD-10-CM | POA: Diagnosis not present

## 2016-10-09 DIAGNOSIS — H53483 Generalized contraction of visual field, bilateral: Secondary | ICD-10-CM | POA: Diagnosis not present

## 2016-10-09 DIAGNOSIS — H40013 Open angle with borderline findings, low risk, bilateral: Secondary | ICD-10-CM | POA: Diagnosis not present

## 2016-10-09 DIAGNOSIS — H2513 Age-related nuclear cataract, bilateral: Secondary | ICD-10-CM | POA: Diagnosis not present

## 2016-10-09 DIAGNOSIS — H04123 Dry eye syndrome of bilateral lacrimal glands: Secondary | ICD-10-CM | POA: Diagnosis not present

## 2016-10-10 DIAGNOSIS — N3944 Nocturnal enuresis: Secondary | ICD-10-CM | POA: Diagnosis not present

## 2016-10-10 DIAGNOSIS — N3946 Mixed incontinence: Secondary | ICD-10-CM | POA: Diagnosis not present

## 2016-10-24 DIAGNOSIS — M1990 Unspecified osteoarthritis, unspecified site: Secondary | ICD-10-CM | POA: Diagnosis not present

## 2016-10-24 DIAGNOSIS — Z6830 Body mass index (BMI) 30.0-30.9, adult: Secondary | ICD-10-CM | POA: Diagnosis not present

## 2016-10-24 DIAGNOSIS — Z23 Encounter for immunization: Secondary | ICD-10-CM | POA: Diagnosis not present

## 2016-10-24 DIAGNOSIS — N3941 Urge incontinence: Secondary | ICD-10-CM | POA: Diagnosis not present

## 2016-10-24 DIAGNOSIS — I10 Essential (primary) hypertension: Secondary | ICD-10-CM | POA: Diagnosis not present

## 2016-10-24 DIAGNOSIS — Z8673 Personal history of transient ischemic attack (TIA), and cerebral infarction without residual deficits: Secondary | ICD-10-CM | POA: Diagnosis not present

## 2016-10-24 DIAGNOSIS — R7303 Prediabetes: Secondary | ICD-10-CM | POA: Diagnosis not present

## 2016-10-24 DIAGNOSIS — Z0000 Encounter for general adult medical examination without abnormal findings: Secondary | ICD-10-CM | POA: Diagnosis not present

## 2016-10-24 DIAGNOSIS — E785 Hyperlipidemia, unspecified: Secondary | ICD-10-CM | POA: Diagnosis not present

## 2016-11-19 DIAGNOSIS — I350 Nonrheumatic aortic (valve) stenosis: Secondary | ICD-10-CM | POA: Diagnosis not present

## 2016-11-25 DIAGNOSIS — I10 Essential (primary) hypertension: Secondary | ICD-10-CM | POA: Diagnosis not present

## 2016-11-25 DIAGNOSIS — I672 Cerebral atherosclerosis: Secondary | ICD-10-CM | POA: Diagnosis not present

## 2016-11-25 DIAGNOSIS — I700 Atherosclerosis of aorta: Secondary | ICD-10-CM | POA: Diagnosis not present

## 2016-11-25 DIAGNOSIS — I6521 Occlusion and stenosis of right carotid artery: Secondary | ICD-10-CM | POA: Diagnosis not present

## 2016-11-25 DIAGNOSIS — I6503 Occlusion and stenosis of bilateral vertebral arteries: Secondary | ICD-10-CM | POA: Diagnosis not present

## 2016-11-25 DIAGNOSIS — I6523 Occlusion and stenosis of bilateral carotid arteries: Secondary | ICD-10-CM | POA: Diagnosis not present

## 2016-11-28 DIAGNOSIS — Z8673 Personal history of transient ischemic attack (TIA), and cerebral infarction without residual deficits: Secondary | ICD-10-CM | POA: Diagnosis not present

## 2016-11-28 DIAGNOSIS — I10 Essential (primary) hypertension: Secondary | ICD-10-CM | POA: Diagnosis not present

## 2016-11-28 DIAGNOSIS — I350 Nonrheumatic aortic (valve) stenosis: Secondary | ICD-10-CM | POA: Diagnosis not present

## 2016-11-28 DIAGNOSIS — I452 Bifascicular block: Secondary | ICD-10-CM | POA: Diagnosis not present

## 2016-12-11 DIAGNOSIS — N3946 Mixed incontinence: Secondary | ICD-10-CM | POA: Diagnosis not present

## 2016-12-11 DIAGNOSIS — R351 Nocturia: Secondary | ICD-10-CM | POA: Diagnosis not present

## 2016-12-18 DIAGNOSIS — M25512 Pain in left shoulder: Secondary | ICD-10-CM | POA: Diagnosis not present

## 2016-12-18 DIAGNOSIS — M25511 Pain in right shoulder: Secondary | ICD-10-CM | POA: Diagnosis not present

## 2016-12-18 DIAGNOSIS — Z96611 Presence of right artificial shoulder joint: Secondary | ICD-10-CM | POA: Diagnosis not present

## 2017-03-20 ENCOUNTER — Encounter: Payer: Self-pay | Admitting: Neurology

## 2017-03-20 ENCOUNTER — Ambulatory Visit (INDEPENDENT_AMBULATORY_CARE_PROVIDER_SITE_OTHER): Payer: Medicare Other | Admitting: Neurology

## 2017-03-20 ENCOUNTER — Telehealth: Payer: Self-pay | Admitting: Neurology

## 2017-03-20 VITALS — BP 128/80 | HR 59 | Ht 68.0 in | Wt 202.0 lb

## 2017-03-20 DIAGNOSIS — E785 Hyperlipidemia, unspecified: Secondary | ICD-10-CM | POA: Diagnosis not present

## 2017-03-20 DIAGNOSIS — I6521 Occlusion and stenosis of right carotid artery: Secondary | ICD-10-CM | POA: Diagnosis not present

## 2017-03-20 DIAGNOSIS — I63311 Cerebral infarction due to thrombosis of right middle cerebral artery: Secondary | ICD-10-CM | POA: Diagnosis not present

## 2017-03-20 DIAGNOSIS — I10 Essential (primary) hypertension: Secondary | ICD-10-CM | POA: Diagnosis not present

## 2017-03-20 NOTE — Telephone Encounter (Signed)
error 

## 2017-03-20 NOTE — Progress Notes (Signed)
NEUROLOGY FOLLOW UP OFFICE NOTE  Peggy [name] [id number]  HISTORY OF PRESENT ILLNESS: Peggy [name] is an 83 year old right-handed woman with hypertension, arthritis and mitral valve prolapse who follows up for stroke.  She is accompanied by her husband who supplements history.     UPDATE: She is on Plavix 75mg  daily for secondary stroke prevention.  She is taking Lipitor 40mg .  LDL from 03/26/16 was 78.  Hgb A1c was 5.7.  She followed up with vascular surgery in November.  CTA of head and neck showed moderate stenosis of the stent due to compression by calcified atherosclerotic plaque in the proximal right ICA as well as 70% stenosis of the left ICA origin, stable.  Repeat CTA is planned in 2 years.  She is feeling well.  HISTORY: On 04/22/14, patient underwent left shoulder replacement.  The next day, she collapsed.  She had left facial weakness, as well as left arm and leg weakness.  She was not a tPA candidate due to the recent surgery.  She was transferred to [facility].  CTA of the head and neck showed right MCA occlusion with reconstitution of flow within its branches, as well as severe proximal right ICA stenosis.  MRI of brain confirmed acute small right MCA territory infarcts.  Echo showed EF of 65-70% with no cardiac source of embolus.  LDL was 63, triglycerides 113 and Hgb A1c 5.4.  She underwent right MCA thrombectomy and right internal carotid angioplasty and stenting.  CTA of the neck from 10/20/14 showed moderate in-stent stenosis at 75%.  CTA of the head and neck from 11/13/15 and 11/17/15 respectively showed no significant intracranial stenosis or occlusion and patent right carotid stent with some residual stenosis due to compression of the mid stent by dense calcium, as well as 70% stenosis of the left ICA origin  PAST MEDICAL HISTORY: Past Medical History:  Diagnosis Date  . Aortic stenosis    trace AS 05/31/14 echo (Dr. Einar Gip)  . Arthritis   . Dysrhythmia     . Hyperlipidemia   . Hypertension   . Mitral valve prolapse    takes antiabiotic with procedures  . Neuropathy    in feet  . Osteoarthritis of left shoulder, primary localized 04/22/2014  . Primary localized osteoarthrosis of right shoulder 11/29/2014  . Stroke ([place]) 04/23/2014   pt. states that she has no deficits.   . Urinary urgency   . Wears glasses     MEDICATIONS: Current Outpatient Medications on File Prior to Visit  Medication Sig Dispense Refill  . calcium carbonate 200 MG capsule Take 250 mg by mouth 2 (two) times daily with a meal.    . cholecalciferol (VITAMIN D) 1000 UNITS tablet Take 500 Units by mouth daily.    . clopidogrel (PLAVIX) 75 MG tablet Take by mouth.    . docusate sodium (COLACE) 100 MG capsule Take 100 mg by mouth daily.    Marland Kitchen glucosamine-chondroitin 500-400 MG tablet Take 1 tablet by mouth 4 (four) times daily.    Marland Kitchen lisinopril (PRINIVIL,ZESTRIL) 20 MG tablet Take 20 mg by mouth daily.    . Multiple Vitamins-Minerals (SENIOR MULTIVITAMIN PLUS) TABS Take 1 tablet by mouth daily.     . Omega-3 Fatty Acids (FISH OIL) 1200 MG CPDR Take 1 capsule by mouth daily.     . pregabalin (LYRICA) 75 MG capsule Take 75 mg by mouth 2 (two) times daily.    Marland Kitchen atorvastatin (LIPITOR) 40 MG tablet Take 40 mg by  mouth daily.     No current facility-administered medications on file prior to visit.     ALLERGIES: No Known Allergies  FAMILY HISTORY: Family History  Problem Relation Age of Onset  . Diabetes Mother   . Cancer Mother   . Heart failure Father   . Stroke Maternal Grandmother   . Cancer Maternal Grandfather        unknown  . Stroke Paternal Grandmother     SOCIAL HISTORY: Social History   Socioeconomic History  . Marital status: Married    Spouse name: Not on file  . Number of children: Not on file  . Years of education: Not on file  . Highest education level: Not on file  Social Needs  . Financial resource strain: Not on file  . Food insecurity -  worry: Not on file  . Food insecurity - inability: Not on file  . Transportation needs - medical: Not on file  . Transportation needs - non-medical: Not on file  Occupational History  . Not on file  Tobacco Use  . Smoking status: Never Smoker  . Smokeless tobacco: Never Used  Substance and Sexual Activity  . Alcohol use: Yes    Alcohol/week: 0.0 oz    Comment: occasionally  . Drug use: No  . Sexual activity: Yes    Partners: Male  Other Topics Concern  . Not on file  Social History Narrative  . Not on file    REVIEW OF SYSTEMS: Constitutional: No fevers, chills, or sweats, no generalized fatigue, change in appetite Eyes: No visual changes, double vision, eye pain Ear, nose and throat: No hearing loss, ear pain, nasal congestion, sore throat Cardiovascular: No chest pain, palpitations Respiratory:  No shortness of breath at rest or with exertion, wheezes GastrointestinaI: No nausea, vomiting, diarrhea, abdominal pain, fecal incontinence Genitourinary:  No dysuria, urinary retention or frequency Musculoskeletal:  No neck pain, back pain Integumentary: No rash, pruritus, skin lesions Neurological: as above Psychiatric: No depression, insomnia, anxiety Endocrine: No palpitations, fatigue, diaphoresis, mood swings, change in appetite, change in weight, increased thirst Hematologic/Lymphatic:  No purpura, petechiae. Allergic/Immunologic: no itchy/runny eyes, nasal congestion, recent allergic reactions, rashes  PHYSICAL EXAM: Vitals:   03/20/17 0918  BP: 128/80  Pulse: (!) 59   General: No acute distress.  Patient appears well-groomed.  Head:  Normocephalic/atraumatic Eyes:  Fundi examined but not visualized Neck: supple, no paraspinal tenderness, full range of motion Heart:  Regular rate and rhythm Lungs:  Clear to auscultation bilaterally Back: No paraspinal tenderness Neurological Exam: alert and oriented to person, place, and time. Attention span and concentration  intact, recent and remote memory intact, fund of knowledge intact.  Speech fluent and not dysarthric, language intact.  CN II-XII intact. Bulk and tone normal, muscle strength 5/5 throughout.  Sensation to light touch  intact.  Deep tendon reflexes 2+ throughout.  Finger to nose testing intact.  Wide-based gait.  Romberg negative.  IMPRESSION: Right MCA stroke, possibly thrombo-embolic from right carotid artery. Right carotid stenosis status post stent Hypertension Hyperlipidemia  PLAN: 1.  Continue Plavix 75mg  daily for secondary stroke prevention 2.  Continue statin therapy (LDL goal should be less than 70) 3.  Continue blood pressure and glycemic control 4.  Follow up with Dr. Harvel Ricks as recommended. 5.  Medication management as per PCP.  Follow up with me as needed.  15 minutes spent face to face with patient, over 50% spent discussing management.  Metta Clines, DO  CC:  London Pepper,  MD

## 2017-03-20 NOTE — Patient Instructions (Addendum)
Continue Plavix, atorvastatin and lisinopril Follow up with your vascular surgeon as recommended Follow up with me as needed

## 2017-04-24 DIAGNOSIS — E7800 Pure hypercholesterolemia, unspecified: Secondary | ICD-10-CM | POA: Diagnosis not present

## 2017-04-24 DIAGNOSIS — I10 Essential (primary) hypertension: Secondary | ICD-10-CM | POA: Diagnosis not present

## 2017-04-24 DIAGNOSIS — R7309 Other abnormal glucose: Secondary | ICD-10-CM | POA: Diagnosis not present

## 2017-04-24 DIAGNOSIS — I679 Cerebrovascular disease, unspecified: Secondary | ICD-10-CM | POA: Diagnosis not present

## 2017-05-08 DIAGNOSIS — H53042 Amblyopia suspect, left eye: Secondary | ICD-10-CM | POA: Diagnosis not present

## 2017-05-08 DIAGNOSIS — H2513 Age-related nuclear cataract, bilateral: Secondary | ICD-10-CM | POA: Diagnosis not present

## 2017-05-08 DIAGNOSIS — H04123 Dry eye syndrome of bilateral lacrimal glands: Secondary | ICD-10-CM | POA: Diagnosis not present

## 2017-05-08 DIAGNOSIS — H40013 Open angle with borderline findings, low risk, bilateral: Secondary | ICD-10-CM | POA: Diagnosis not present

## 2017-05-08 DIAGNOSIS — H53483 Generalized contraction of visual field, bilateral: Secondary | ICD-10-CM | POA: Diagnosis not present

## 2017-07-03 DIAGNOSIS — L723 Sebaceous cyst: Secondary | ICD-10-CM | POA: Diagnosis not present

## 2017-07-03 DIAGNOSIS — L821 Other seborrheic keratosis: Secondary | ICD-10-CM | POA: Diagnosis not present

## 2017-09-29 DIAGNOSIS — Z23 Encounter for immunization: Secondary | ICD-10-CM | POA: Diagnosis not present

## 2017-10-30 DIAGNOSIS — Z8673 Personal history of transient ischemic attack (TIA), and cerebral infarction without residual deficits: Secondary | ICD-10-CM | POA: Diagnosis not present

## 2017-10-30 DIAGNOSIS — I10 Essential (primary) hypertension: Secondary | ICD-10-CM | POA: Diagnosis not present

## 2017-10-30 DIAGNOSIS — R7303 Prediabetes: Secondary | ICD-10-CM | POA: Diagnosis not present

## 2017-10-30 DIAGNOSIS — E785 Hyperlipidemia, unspecified: Secondary | ICD-10-CM | POA: Diagnosis not present

## 2017-10-30 DIAGNOSIS — N3941 Urge incontinence: Secondary | ICD-10-CM | POA: Diagnosis not present

## 2017-10-30 DIAGNOSIS — Z0000 Encounter for general adult medical examination without abnormal findings: Secondary | ICD-10-CM | POA: Diagnosis not present

## 2017-11-12 DIAGNOSIS — H04123 Dry eye syndrome of bilateral lacrimal glands: Secondary | ICD-10-CM | POA: Diagnosis not present

## 2017-11-12 DIAGNOSIS — H2513 Age-related nuclear cataract, bilateral: Secondary | ICD-10-CM | POA: Diagnosis not present

## 2017-11-12 DIAGNOSIS — H53042 Amblyopia suspect, left eye: Secondary | ICD-10-CM | POA: Diagnosis not present

## 2017-11-12 DIAGNOSIS — H40013 Open angle with borderline findings, low risk, bilateral: Secondary | ICD-10-CM | POA: Diagnosis not present

## 2017-11-12 DIAGNOSIS — H53483 Generalized contraction of visual field, bilateral: Secondary | ICD-10-CM | POA: Diagnosis not present

## 2017-11-27 DIAGNOSIS — I452 Bifascicular block: Secondary | ICD-10-CM | POA: Diagnosis not present

## 2017-11-27 DIAGNOSIS — Z8673 Personal history of transient ischemic attack (TIA), and cerebral infarction without residual deficits: Secondary | ICD-10-CM | POA: Diagnosis not present

## 2017-11-27 DIAGNOSIS — I10 Essential (primary) hypertension: Secondary | ICD-10-CM | POA: Diagnosis not present

## 2017-11-27 DIAGNOSIS — I350 Nonrheumatic aortic (valve) stenosis: Secondary | ICD-10-CM | POA: Diagnosis not present

## 2018-06-22 DIAGNOSIS — G609 Hereditary and idiopathic neuropathy, unspecified: Secondary | ICD-10-CM | POA: Diagnosis not present

## 2018-06-22 DIAGNOSIS — I679 Cerebrovascular disease, unspecified: Secondary | ICD-10-CM | POA: Diagnosis not present

## 2018-06-22 DIAGNOSIS — I350 Nonrheumatic aortic (valve) stenosis: Secondary | ICD-10-CM | POA: Diagnosis not present

## 2018-06-22 DIAGNOSIS — I10 Essential (primary) hypertension: Secondary | ICD-10-CM | POA: Diagnosis not present

## 2018-06-22 DIAGNOSIS — E7800 Pure hypercholesterolemia, unspecified: Secondary | ICD-10-CM | POA: Diagnosis not present

## 2018-07-24 DIAGNOSIS — L821 Other seborrheic keratosis: Secondary | ICD-10-CM | POA: Diagnosis not present

## 2018-07-24 DIAGNOSIS — L309 Dermatitis, unspecified: Secondary | ICD-10-CM | POA: Diagnosis not present

## 2018-07-24 DIAGNOSIS — L723 Sebaceous cyst: Secondary | ICD-10-CM | POA: Diagnosis not present

## 2018-07-24 DIAGNOSIS — B353 Tinea pedis: Secondary | ICD-10-CM | POA: Diagnosis not present

## 2018-09-15 DIAGNOSIS — Z23 Encounter for immunization: Secondary | ICD-10-CM | POA: Diagnosis not present

## 2018-11-19 DIAGNOSIS — N3941 Urge incontinence: Secondary | ICD-10-CM | POA: Diagnosis not present

## 2018-11-19 DIAGNOSIS — R7303 Prediabetes: Secondary | ICD-10-CM | POA: Diagnosis not present

## 2018-11-19 DIAGNOSIS — Z8673 Personal history of transient ischemic attack (TIA), and cerebral infarction without residual deficits: Secondary | ICD-10-CM | POA: Diagnosis not present

## 2018-11-19 DIAGNOSIS — I10 Essential (primary) hypertension: Secondary | ICD-10-CM | POA: Diagnosis not present

## 2018-11-19 DIAGNOSIS — E785 Hyperlipidemia, unspecified: Secondary | ICD-10-CM | POA: Diagnosis not present

## 2018-11-19 DIAGNOSIS — Z0000 Encounter for general adult medical examination without abnormal findings: Secondary | ICD-10-CM | POA: Diagnosis not present

## 2018-11-25 DIAGNOSIS — M25512 Pain in left shoulder: Secondary | ICD-10-CM | POA: Diagnosis not present

## 2018-11-25 DIAGNOSIS — Z96611 Presence of right artificial shoulder joint: Secondary | ICD-10-CM | POA: Diagnosis not present

## 2018-12-03 DIAGNOSIS — I63319 Cerebral infarction due to thrombosis of unspecified middle cerebral artery: Secondary | ICD-10-CM | POA: Diagnosis not present

## 2018-12-03 DIAGNOSIS — I6522 Occlusion and stenosis of left carotid artery: Secondary | ICD-10-CM | POA: Diagnosis not present

## 2018-12-03 DIAGNOSIS — I6523 Occlusion and stenosis of bilateral carotid arteries: Secondary | ICD-10-CM | POA: Diagnosis not present

## 2018-12-03 DIAGNOSIS — T82858A Stenosis of vascular prosthetic devices, implants and grafts, initial encounter: Secondary | ICD-10-CM | POA: Diagnosis not present

## 2018-12-28 ENCOUNTER — Encounter: Payer: Self-pay | Admitting: Cardiology

## 2018-12-28 ENCOUNTER — Ambulatory Visit (INDEPENDENT_AMBULATORY_CARE_PROVIDER_SITE_OTHER): Payer: Medicare Other | Admitting: Cardiology

## 2018-12-28 ENCOUNTER — Other Ambulatory Visit: Payer: Self-pay

## 2018-12-28 VITALS — BP 125/64 | HR 58 | Temp 97.7°F | Ht 67.0 in | Wt 184.0 lb

## 2018-12-28 DIAGNOSIS — I10 Essential (primary) hypertension: Secondary | ICD-10-CM

## 2018-12-28 DIAGNOSIS — I350 Nonrheumatic aortic (valve) stenosis: Secondary | ICD-10-CM

## 2018-12-28 DIAGNOSIS — E7800 Pure hypercholesterolemia, unspecified: Secondary | ICD-10-CM

## 2018-12-28 DIAGNOSIS — I6522 Occlusion and stenosis of left carotid artery: Secondary | ICD-10-CM

## 2018-12-28 DIAGNOSIS — I452 Bifascicular block: Secondary | ICD-10-CM

## 2018-12-28 NOTE — Progress Notes (Signed)
Primary Physician/Referring:  London Pepper, MD  Patient ID: Peggy [name], female    DOB: 07-01-1934, 84 y.o.   MRN: [id number]  Chief Complaint  Patient presents with  . Hypertension  . Bradycardia   HPI:    [name]  is a 84 y.o. Aortic stenosis.She has history of hypertension, peripheral neuropathy, bifascicular block on EKG and bradycardia, carotid stenosis and hyperlipidemia. She also has mild aortic stenosis by echocardiogram performed in 2018.  Perioperatively after left shoulder surgery in 2016 had developed acute stroke SP right ICA stenting.  Follows neurosurgery at [facility], Dr. Darnelle Catalan hwo has released her since 12/03/18. No residual ICA stenosis. She also had IC angioplasty at that time.  . She now presents here for annual visit of aortic stenosis. No specific symptoms, denies any worsening dyspnea, denies any chest pain or palpitations or symptoms suggestive of bradycardia related fatigue or syncope. She is active.  Past Medical History:  Diagnosis Date  . Aortic stenosis    trace AS 05/31/14 echo (Dr. Einar Gip)  . Arthritis   . Dysrhythmia   . Hyperlipidemia   . Hypertension   . Mitral valve prolapse    takes antiabiotic with procedures  . Neuropathy    in feet  . Osteoarthritis of left shoulder, primary localized 04/22/2014  . Primary localized osteoarthrosis of right shoulder 11/29/2014  . Stroke ([place]) 04/23/2014   pt. states that she has no deficits.   . Urinary urgency   . Wears glasses    Past Surgical History:  Procedure Laterality Date  . ANGIOPLASTY    . BACK SURGERY     L5 removed 1986  . CARPAL TUNNEL RELEASE Right 2014  . COLONOSCOPY    . JOINT REPLACEMENT     bilat knees  . THROMBECTOMY    . TOTAL SHOULDER ARTHROPLASTY Left 04/22/2014   Procedure: LEFT TOTAL SHOULDER ARTHROPLASTY;  Surgeon: Marchia Bond, MD;  Location: [facility];  Service: Orthopedics;  Laterality: Left;  . TOTAL SHOULDER ARTHROPLASTY Right 11/29/2014    Procedure: TOTAL SHOULDER ARTHROPLASTY;  Surgeon: Marchia Bond, MD;  Location: [facility];  Service: Orthopedics;  Laterality: Right;   Social History   Socioeconomic History  . Marital status: Married    Spouse name: Not on file  . Number of children: 0  . Years of education: Not on file  . Highest education level: Not on file  Occupational History  . Not on file  Tobacco Use  . Smoking status: Never Smoker  . Smokeless tobacco: Never Used  Substance and Sexual Activity  . Alcohol use: Yes    Alcohol/week: 0.0 standard drinks    Comment: occasionally  . Drug use: No  . Sexual activity: Yes    Partners: Male  Other Topics Concern  . Not on file  Social History Narrative  . Not on file   Social Determinants of Health   Financial Resource Strain:   . Difficulty of Paying Living Expenses: Not on file  Food Insecurity:   . Worried About Charity fundraiser in the Last Year: Not on file  . Ran Out of Food in the Last Year: Not on file  Transportation Needs:   . Lack of Transportation (Medical): Not on file  . Lack of Transportation (Non-Medical): Not on file  Physical Activity:   . Days of Exercise per Week: Not on file  . Minutes of Exercise per Session: Not on file  Stress:   . Feeling of Stress : Not on  file  Social Connections:   . Frequency of Communication with Friends and Family: Not on file  . Frequency of Social Gatherings with Friends and Family: Not on file  . Attends Religious Services: Not on file  . Active Member of Clubs or Organizations: Not on file  . Attends Archivist Meetings: Not on file  . Marital Status: Not on file  Intimate Partner Violence:   . Fear of Current or Ex-Partner: Not on file  . Emotionally Abused: Not on file  . Physically Abused: Not on file  . Sexually Abused: Not on file   ROS  Review of Systems  Constitution: Negative for chills, decreased appetite, malaise/fatigue and weight gain.  Cardiovascular: Negative for  dyspnea on exertion, leg swelling and syncope.  Endocrine: Negative for cold intolerance.  Hematologic/Lymphatic: Does not bruise/bleed easily.  Musculoskeletal: Positive for joint pain (multiple joints). Negative for joint swelling.  Gastrointestinal: Negative for abdominal pain, anorexia, change in bowel habit, hematochezia and melena.  Neurological: Negative for headaches and light-headedness.  Psychiatric/Behavioral: Negative for depression and substance abuse.  All other systems reviewed and are negative.  Objective  Blood pressure 125/64, pulse (!) 58, temperature 97.7 F (36.5 C), height '5\' 7"'$  (1.702 m), weight 184 lb (83.5 kg), SpO2 96 %.  Vitals with BMI 12/28/2018 03/20/2017 03/20/2016  Height '5\' 7"'$  '5\' 8"'$  '5\' 8"'$   Weight 184 lbs 202 lbs 203 lbs 4 oz  BMI 62.56 38.93 31  Systolic 734 287 681  Diastolic 64 80 78  Pulse 58 59 86      Physical Exam  Constitutional: She appears well-developed and well-nourished. No distress.  HENT:  Head: Atraumatic.  Eyes: Conjunctivae are normal.  Neck: No thyromegaly present.  Cardiovascular: Normal rate, regular rhythm, S1 normal and S2 normal. Exam reveals no gallop.  Murmur heard.  Harsh midsystolic murmur is present with a grade of 3/6 at the upper right sternal border and apex radiating to the neck. Pulses:      Carotid pulses are 2+ on the right side with bruit and 2+ on the left side with bruit.      Femoral pulses are 2+ on the right side and 2+ on the left side.      Popliteal pulses are 1+ on the right side and 1+ on the left side.       Dorsalis pedis pulses are 1+ on the right side and 1+ on the left side.       Posterior tibial pulses are 0 on the right side and 0 on the left side.  No JVD, No leg edema.   Pulmonary/Chest: Effort normal and breath sounds normal.  Abdominal: Soft. Bowel sounds are normal.  Musculoskeletal:        General: Normal range of motion.     Cervical back: Neck supple.  Neurological: She is alert.    Skin: Skin is warm and dry.  Psychiatric: She has a normal mood and affect.   Laboratory examination:   No results for input(s): NA, K, CL, CO2, GLUCOSE, BUN, CREATININE, CALCIUM, GFRNONAA, GFRAA in the last 8760 hours. CrCl cannot be calculated (Patient's most recent lab result is older than the maximum 21 days allowed.).  CMP Latest Ref Rng & Units 11/30/2014 11/23/2014 10/28/2014  Glucose 65 - 99 mg/dL 123(H) 105(H) 105(H)  BUN 6 - 20 mg/dL '11 15 18  '$ Creatinine 0.44 - 1.00 mg/dL 0.76 0.69 0.72  Sodium 135 - 145 mmol/L 136 138 139  Potassium 3.5 -  5.1 mmol/L 4.1 4.3 4.4  Chloride 101 - 111 mmol/L 100(L) 102 105  CO2 22 - 32 mmol/L '30 26 29  '$ Calcium 8.9 - 10.3 mg/dL 8.6(L) 9.3 9.1  Total Protein 6.0 - 8.3 g/dL - - -  Total Bilirubin 0.3 - 1.2 mg/dL - - -  Alkaline Phos 39 - 117 U/L - - -  AST 0 - 37 U/L - - -  ALT 0 - 35 U/L - - -   CBC Latest Ref Rng & Units 11/30/2014 11/23/2014 10/28/2014  WBC 4.0 - 10.5 K/uL 10.4 5.8 7.9  Hemoglobin 12.0 - 15.0 g/dL 12.7 14.9 14.7  Hematocrit 36.0 - 46.0 % 37.8 45.9 44.1  Platelets 150 - 400 K/uL 246 303 302   Lipid Panel  No results found for: CHOL, TRIG, HDL, CHOLHDL, VLDL, LDLCALC, LDLDIRECT HEMOGLOBIN A1C Lab Results  Component Value Date   HGBA1C 5.9 (H) 11/23/2014   MPG 123 11/23/2014   TSH No results for input(s): TSH in the last 8760 hours.   Labs 11/03/2017: A1c 5.9%. Serum glucose 96 g, BUN 19, creatinine 0.73, eGFR greater than 60 mL, BMP normal. Triglycerides 153, total cholesterol 163, HDL 42, LDL 90.  Medications and allergies  No Known Allergies   Current Outpatient Medications  Medication Instructions  . atorvastatin (LIPITOR) 80 mg, Oral, Daily  . calcium carbonate 250 mg, Oral, 2 times daily with meals  . cholecalciferol (VITAMIN D) 500 Units, Oral, Daily  . clopidogrel (PLAVIX) 75 MG tablet Oral  . docusate sodium (COLACE) 100 mg, Oral, Daily  . glucosamine-chondroitin 500-400 MG tablet 1 tablet, Oral, 4  times daily  . lisinopril (ZESTRIL) 20 mg, Oral, Daily  . Multiple Vitamins-Minerals (SENIOR MULTIVITAMIN PLUS) TABS 1 tablet, Oral, Daily  . Omega-3 Fatty Acids (FISH OIL) 1200 MG CPDR 1 capsule, Oral, Daily  . pregabalin (LYRICA) 75 mg, Oral, 2 times daily    Radiology:   CTA Head 12/04/2018: 1. Right carotid stent remains patent. There is narrowing of the lumen of the stent due to the calcified plaque resulting in about a 50% stenosis. This is unchanged from the prior study. 2. 56% stenosis origin of the left internal carotid artery. 3. Normal intracranial CT angiogram.  Cardiac Studies:   Echocardiogram 11/19/2016: Left ventricle cavity is normal in size. Mild concentric hypertrophy of the left ventricle. Normal global wall motion. Visual EF is 50-55%. Doppler evidence of grade I (impaired) diastolic dysfunction, normal LAP. Mild aortic valve leaflet calcification with mildly restricted aortic valve leaflets. Mild aortic valve stenosis. Aortic valve mean gradient of 9 mmHg, Vmax of 2.1 m/s. Calculated aortic valve area by continuity equation is 1.4 cm. Mild tricuspid regurgitation. Pulmonary artery systolic pressure is estimated at 25-30 mm Hg. No significant change from prior study dated 05/31/2014.  Assessment     ICD-10-CM   1. Bifascicular block  I45.2 EKG 12-Lead  2. Mild aortic stenosis  I35.0 PCV ECHOCARDIOGRAM COMPLETE  3. Essential hypertension  I10 PCV ECHOCARDIOGRAM COMPLETE  4. Hypercholesteremia  E78.00   5. Asymptomatic stenosis of left carotid artery. H/O Right ICA stent 2016  I65.22 PCV CAROTID DUPLEX (BILATERAL)    EKG 12/28/2018: Sinus bradycardia at rate of 56 bpm with first-degree AV block, left axis deviation, left anterior fascicular block.  Incomplete right bundle branch block.  Poor R wave progression, cannot exclude anteroseptal infarct old.  Normal QT interval.  No evidence of ischemia.  No significant change since 11/27/2017.  Recommendations:  No  orders of the defined  types were placed in this encounter.   She is currently doing well and has not had any symptoms of marked unexplained fatigue or dizziness or near syncope, Bifascicular block is remained stable.  With regard to carotid stenosis, she has now been released by neurosurgery, I'll take over the care of the same, she will need carotid artery duplex in 6 months, I'll also repeat echocardiogram to follow-up on aortic stenosis of the murmur appears much more prominent at the same time.  I will see her back at that time.  With regard to hypertension and blood pressure, both of well-controlled.  I did not make any changes to her medications.  Adrian Prows, MD, [facility] 12/28/2018, 3:34 PM [facility] Pager: [phone] Office: [phone]

## 2019-02-27 ENCOUNTER — Ambulatory Visit: Payer: Medicare Other | Attending: Internal Medicine

## 2019-02-27 DIAGNOSIS — Z23 Encounter for immunization: Secondary | ICD-10-CM

## 2019-02-27 NOTE — Progress Notes (Signed)
   Covid-19 Vaccination Clinic  Name:  Francies Montagnino    MRN: [id number] DOB: 04/21/1934  02/27/2019  Ms. Amparan was observed post Covid-19 immunization for 15 minutes without incidence. She was provided with Vaccine Information Sheet and instruction to access the V-Safe system.   Ms. Bartles was instructed to call 911 with any severe reactions post vaccine: Marland Kitchen Difficulty breathing  . Swelling of your face and throat  . A fast heartbeat  . A bad rash all over your body  . Dizziness and weakness    Immunizations Administered    Name Date Dose VIS Date Route   Pfizer COVID-19 Vaccine 02/27/2019  8:40 AM 0.3 mL 12/25/2018 Intramuscular   Manufacturer: [place]   Lot: [id number]   [place]: [id number]

## 2019-03-21 ENCOUNTER — Ambulatory Visit: Payer: Medicare Other | Attending: Internal Medicine

## 2019-03-21 DIAGNOSIS — Z23 Encounter for immunization: Secondary | ICD-10-CM

## 2019-03-21 NOTE — Progress Notes (Signed)
   Covid-19 Vaccination Clinic  Name:  Peggy [name]    MRN: [id number] DOB: September 21, 1934  03/21/2019  Peggy [name] was observed post Covid-19 immunization for 15 minutes without incident. She was provided with Vaccine Information Sheet and instruction to access the V-Safe system.   Peggy [name] was instructed to call 911 with any severe reactions post vaccine: Marland Kitchen Difficulty breathing  . Swelling of face and throat  . A fast heartbeat  . A bad rash all over body  . Dizziness and weakness   Immunizations Administered    Name Date Dose VIS Date Route   Pfizer COVID-19 Vaccine 03/21/2019  8:24 AM 0.3 mL 12/25/2018 Intramuscular   Manufacturer: [place]   Lot: [id number]   [place]: [id number]

## 2019-04-13 DIAGNOSIS — H2513 Age-related nuclear cataract, bilateral: Secondary | ICD-10-CM | POA: Diagnosis not present

## 2019-04-13 DIAGNOSIS — H04123 Dry eye syndrome of bilateral lacrimal glands: Secondary | ICD-10-CM | POA: Diagnosis not present

## 2019-04-13 DIAGNOSIS — H53042 Amblyopia suspect, left eye: Secondary | ICD-10-CM | POA: Diagnosis not present

## 2019-05-19 DIAGNOSIS — G609 Hereditary and idiopathic neuropathy, unspecified: Secondary | ICD-10-CM | POA: Diagnosis not present

## 2019-05-19 DIAGNOSIS — R7303 Prediabetes: Secondary | ICD-10-CM | POA: Diagnosis not present

## 2019-05-19 DIAGNOSIS — E7800 Pure hypercholesterolemia, unspecified: Secondary | ICD-10-CM | POA: Diagnosis not present

## 2019-05-19 DIAGNOSIS — I679 Cerebrovascular disease, unspecified: Secondary | ICD-10-CM | POA: Diagnosis not present

## 2019-05-19 DIAGNOSIS — I10 Essential (primary) hypertension: Secondary | ICD-10-CM | POA: Diagnosis not present

## 2019-06-21 ENCOUNTER — Other Ambulatory Visit: Payer: Self-pay

## 2019-06-21 ENCOUNTER — Ambulatory Visit: Payer: Medicare Other

## 2019-06-21 DIAGNOSIS — I10 Essential (primary) hypertension: Secondary | ICD-10-CM | POA: Diagnosis not present

## 2019-06-21 DIAGNOSIS — I350 Nonrheumatic aortic (valve) stenosis: Secondary | ICD-10-CM | POA: Diagnosis not present

## 2019-06-21 DIAGNOSIS — I6523 Occlusion and stenosis of bilateral carotid arteries: Secondary | ICD-10-CM

## 2019-06-21 DIAGNOSIS — I6522 Occlusion and stenosis of left carotid artery: Secondary | ICD-10-CM

## 2019-06-22 NOTE — Progress Notes (Signed)
Frequent PVC. NOrmal LVEF. Mild AS and discuss on OV

## 2019-06-23 ENCOUNTER — Other Ambulatory Visit: Payer: Self-pay | Admitting: Cardiology

## 2019-06-23 DIAGNOSIS — I6523 Occlusion and stenosis of bilateral carotid arteries: Secondary | ICD-10-CM

## 2019-06-23 NOTE — Progress Notes (Signed)
Has h/o Bilateral carotid stenosis and right carotid stent. Stable study. DIscuss on OV

## 2019-06-25 NOTE — Progress Notes (Signed)
Caucasian female  Primary Physician/Referring:  London Pepper, MD  Patient ID: Peggy [name], female    DOB: 1934/11/10, 85 y.o.   MRN: [id number]  Chief Complaint  Patient presents with  . Aortic Stenosis  . Bradycardia  . Results    Echocardiogram, carotid duplex   HPI:    Peggy [name]  is a 85 y.o. Caucasian female with very mild aortic stenosis, hypertension, peripheral neuropathy, bifascicular block on EKG and bradycardia, asymptomatic bilateral carotid stenosis and history of acute stroke s/p right ICA stenting after left shoulder surgery in 2016.  She presents here for 83-monthoffice visit.  Fortunately she is remained asymptomatic, continues to remain active, she has not had any recurrence of TIA-like symptoms, no dizziness or syncope. or symptoms suggestive of bradycardia related fatigue or syncope. She is active.  Past Medical History:  Diagnosis Date  . Aortic stenosis    trace AS 05/31/14 echo (Dr. GEinar Gip  . Arthritis   . Dysrhythmia   . Hyperlipidemia   . Hypertension   . Mitral valve prolapse    takes antiabiotic with procedures  . Neuropathy    in feet  . Osteoarthritis of left shoulder, primary localized 04/22/2014  . Primary localized osteoarthrosis of right shoulder 11/29/2014  . [place]   pt. states that she has no deficits.   . Urinary urgency   . Wears glasses    Past Surgical History:  Procedure Laterality Date  . ANGIOPLASTY    . BACK SURGERY     L5 removed 1986  . CARPAL TUNNEL RELEASE Right 2014  . COLONOSCOPY    . JOINT REPLACEMENT     bilat knees  . THROMBECTOMY    . TOTAL SHOULDER ARTHROPLASTY Left 04/22/2014   Procedure: LEFT TOTAL SHOULDER ARTHROPLASTY;  Surgeon: JMarchia Bond MD;  [facility]  Service: Orthopedics;  Laterality: Left;  . TOTAL SHOULDER ARTHROPLASTY Right 11/29/2014   Procedure: TOTAL SHOULDER ARTHROPLASTY;  Surgeon: JMarchia Bond MD;  [place] [facility]  Service: Orthopedics;   Laterality: Right;   Family History  Problem Relation Age of Onset  . Diabetes Mother   . Cancer Mother   . Heart failure Father   . Stroke Maternal Grandmother   . Cancer Maternal Grandfather        unknown  . Stroke Paternal Grandmother    Social History   Tobacco Use  . Smoking status: Never Smoker  . Smokeless tobacco: Never Used  Substance Use Topics  . Alcohol use: Yes    Alcohol/week: 0.0 standard drinks    Comment: occasionally   Marital Status: Married ROS  Review of Systems  Cardiovascular: Negative for dyspnea on exertion, leg swelling and syncope.  Musculoskeletal: Positive for back pain and joint pain (multiple joints). Negative for joint swelling.  Gastrointestinal: Negative for melena.  Psychiatric/Behavioral: Negative for depression and substance abuse.  All other systems reviewed and are negative.  Objective  Blood pressure 134/60, pulse (!) 55, resp. rate 16, height _0  (1.702 m), weight 189 lb (85.7 kg), SpO2 96 %.  Vitals with BMI 06/28/2019 12/28/2018 03/20/2017  Height _1  _2  _3   Weight 189 lbs 184 lbs 202 lbs  BMI 29.59 [phone] Systolic [id number] Diastolic 60 64 80  Pulse 55 58 59      Physical Exam Constitutional:      General: She is not in acute distress.    Appearance: She is well-developed.  Neck:  Thyroid: No thyromegaly.  Cardiovascular:     Rate and Rhythm: Normal rate and regular rhythm.     Pulses:          Carotid pulses are 2+ on the right side with bruit and 2+ on the left side with bruit.      Femoral pulses are 2+ on the right side and 2+ on the left side.      Popliteal pulses are 2+ on the right side and 2+ on the left side.       Dorsalis pedis pulses are 1+ on the right side and 1+ on the left side.       Posterior tibial pulses are 0 on the right side and 0 on the left side.     Heart sounds: S1 normal and S2 normal. Murmur heard.  Harsh midsystolic murmur is present with a grade of 3/6 at the upper  right sternal border and apex radiating to the neck.  No gallop.      Comments: No JVD, No leg edema.  Pulmonary:     Effort: Pulmonary effort is normal.     Breath sounds: Normal breath sounds.  Abdominal:     General: Bowel sounds are normal.     Palpations: Abdomen is soft.  Musculoskeletal:        General: Normal range of motion.    Laboratory examination:   External Labs:  Cholesterol, total 142.000 05/19/2019 Triglycerides 120.000 05/19/2019 HDL 41.000 05/19/2019 LDL-C 79.000 05/19/2019  A1C 5.800 05/19/2019  Glucose Random 120.000 05/19/2019 BUN 17.000 05/19/2019 Creatinine, Serum 0.780 05/19/2019  11/03/2017: A1c 5.9%. Serum glucose 96 g, BUN 19, creatinine 0.73, eGFR greater than 60 mL, BMP normal. Triglycerides 153, total cholesterol 163, HDL 42, LDL 90.  Medications and allergies  No Known Allergies   Current Outpatient Medications  Medication Instructions  . atorvastatin (LIPITOR) 80 mg, Oral, Daily  . calcium carbonate 250 mg, Oral, 2 times daily with meals  . cholecalciferol (VITAMIN D) 500 Units, Oral, Daily  . clopidogrel (PLAVIX) 75 MG tablet Oral  . docusate sodium (COLACE) 100 mg, Oral, Daily  . glucosamine-chondroitin 500-400 MG tablet 1 tablet, Oral, 4 times daily  . lisinopril (ZESTRIL) 20 mg, Oral, Daily  . Multiple Vitamins-Minerals (SENIOR MULTIVITAMIN PLUS) TABS 1 tablet, Oral, Daily  . Omega-3 Fatty Acids (FISH OIL) 1200 MG CPDR 1 capsule, Oral, Daily  . pregabalin (LYRICA) 75 mg, Oral, 2 times daily   There are no discontinued medications.  Radiology:   CTA Head 12/04/2018: 1. Right carotid stent remains patent. There is narrowing of the lumen of the stent due to the calcified plaque resulting in about a 50% stenosis. This is unchanged from the prior study. 2. 56% stenosis origin of the left internal carotid artery. 3. Normal intracranial CT angiogram.  Cardiac Studies:   Echocardiogram 11/19/2016: Left ventricle cavity is normal in size. Mild  concentric hypertrophy of the left ventricle. Normal global wall motion. Visual EF is 50-55%. Doppler evidence of grade I (impaired) diastolic dysfunction, normal LAP. Mild aortic valve leaflet calcification with mildly restricted aortic valve leaflets. Mild aortic valve stenosis. Aortic valve mean gradient of 9 mmHg, Vmax of 2.1 m/s. Calculated aortic valve area by continuity equation is 1.4 cm. Mild tricuspid regurgitation. Pulmonary artery systolic pressure is estimated at 25-30 mm Hg. No significant change from prior study dated 05/31/2014.  Carotid artery duplex 06/21/2019:  Stenosis in the right internal carotid artery (50-69%). Stenosis in the right external carotid artery (<50%). Distal  right ICA stent history and appears patent.  Stenosis in the left internal carotid artery (50-69%).  Antegrade right vertebral artery flow. Antegrade left vertebral artery flow.  Follow up in six months is appropriate if clinically indicated.  Echocardiogram 06/21/2019:  Frequent ectopy seen throughout the study.  Left ventricle cavity is normal in size. Mild concentric hypertrophy of the left ventricle. Normal global wall motion. Normal LV systolic function with EF 55%.  Doppler evidence of grade I (impaired) diastolic dysfunction, normal LAP.  Trileaflet aortic valve with mild calcification. Mild aortic valve stenosis. Aortic valve mean gradient of 10 mmHg, Vmax of 2.1 m/s.  Calculated aortic valve area by continuity equation is 1.4 cm. No regurgitation.  Mild tricuspid regurgitation. Estimated pulmonary artery systolic pressure is 22 mmHg.  No significant change compared to previous study on 11/19/2016.  EKG:  EKG 06/28/2019: Sinus rhythm with first-degree AV block at the rate of 76 bpm, left axis deviation, left anterior fascicular block.  Incomplete right bundle branch block.  Bifascicular block.  Poor R wave progression, cannot exclude anterolateral infarct old.  Frequent PVCs (3).  No significant  change from 12/28/2018.   Assessment     ICD-10-CM   1. Mild aortic stenosis  I35.0 EKG 12-Lead  2. Asymptomatic bilateral carotid artery stenosis  I65.23   3. Bifascicular block  I45.2   4. Essential hypertension  I10   5. Hypercholesteremia  E78.00      Recommendations:   Peggy [name]  is a 85 y.o. Caucasian female with very mild aortic stenosis, hypertension, peripheral neuropathy, bifascicular block on EKG and bradycardia, asymptomatic bilateral carotid stenosis and history of acute stroke s/p right ICA stenting after left shoulder surgery in 2016.  She presents here for 52-monthoffice visit.  She is currently doing well and has not had any symptoms of marked unexplained fatigue or dizziness or near syncope, Bifascicular block is remained stable.  With regard to carotid stenosis, I reviewed the results of the carotid artery duplex, she will continue on 6 monthly surveillance for now.  I reviewed the lipids from PCP, LDL improved to 90 to the present 79, advised her to add flaxseed flakes to her main meal every day she is already been doing this for breakfast.   With regard to hypertension and blood pressure, both of well-controlled.  I did not make any changes to her medications.  I will see her back in 6 months.  JAdrian Prows MD, [facility]6/14/2021, 9:28 AM [facility] Pager: [phone] Office: [phone]

## 2019-06-28 ENCOUNTER — Other Ambulatory Visit: Payer: Self-pay

## 2019-06-28 ENCOUNTER — Encounter: Payer: Self-pay | Admitting: Cardiology

## 2019-06-28 ENCOUNTER — Ambulatory Visit: Payer: Medicare Other | Admitting: Cardiology

## 2019-06-28 VITALS — BP 134/60 | HR 55 | Resp 16 | Ht 67.0 in | Wt 189.0 lb

## 2019-06-28 DIAGNOSIS — I10 Essential (primary) hypertension: Secondary | ICD-10-CM

## 2019-06-28 DIAGNOSIS — I350 Nonrheumatic aortic (valve) stenosis: Secondary | ICD-10-CM | POA: Diagnosis not present

## 2019-06-28 DIAGNOSIS — E7800 Pure hypercholesterolemia, unspecified: Secondary | ICD-10-CM

## 2019-06-28 DIAGNOSIS — I6523 Occlusion and stenosis of bilateral carotid arteries: Secondary | ICD-10-CM | POA: Diagnosis not present

## 2019-06-28 DIAGNOSIS — I452 Bifascicular block: Secondary | ICD-10-CM | POA: Diagnosis not present

## 2019-08-17 DIAGNOSIS — L570 Actinic keratosis: Secondary | ICD-10-CM | POA: Diagnosis not present

## 2019-08-17 DIAGNOSIS — L578 Other skin changes due to chronic exposure to nonionizing radiation: Secondary | ICD-10-CM | POA: Diagnosis not present

## 2019-08-17 DIAGNOSIS — L89152 Pressure ulcer of sacral region, stage 2: Secondary | ICD-10-CM | POA: Diagnosis not present

## 2019-08-17 DIAGNOSIS — B079 Viral wart, unspecified: Secondary | ICD-10-CM | POA: Diagnosis not present

## 2019-08-17 DIAGNOSIS — D485 Neoplasm of uncertain behavior of skin: Secondary | ICD-10-CM | POA: Diagnosis not present

## 2019-08-17 DIAGNOSIS — L821 Other seborrheic keratosis: Secondary | ICD-10-CM | POA: Diagnosis not present

## 2019-08-17 DIAGNOSIS — L300 Nummular dermatitis: Secondary | ICD-10-CM | POA: Diagnosis not present

## 2019-08-17 DIAGNOSIS — L723 Sebaceous cyst: Secondary | ICD-10-CM | POA: Diagnosis not present

## 2019-09-26 DIAGNOSIS — Z23 Encounter for immunization: Secondary | ICD-10-CM | POA: Diagnosis not present

## 2019-09-28 DIAGNOSIS — L989 Disorder of the skin and subcutaneous tissue, unspecified: Secondary | ICD-10-CM | POA: Diagnosis not present

## 2019-09-28 DIAGNOSIS — L720 Epidermal cyst: Secondary | ICD-10-CM | POA: Diagnosis not present

## 2019-09-28 DIAGNOSIS — L723 Sebaceous cyst: Secondary | ICD-10-CM | POA: Diagnosis not present

## 2019-10-21 DIAGNOSIS — Z23 Encounter for immunization: Secondary | ICD-10-CM | POA: Diagnosis not present

## 2019-12-15 ENCOUNTER — Other Ambulatory Visit: Payer: Self-pay

## 2019-12-15 ENCOUNTER — Ambulatory Visit: Payer: Medicare Other

## 2019-12-15 DIAGNOSIS — I6523 Occlusion and stenosis of bilateral carotid arteries: Secondary | ICD-10-CM | POA: Diagnosis not present

## 2019-12-16 ENCOUNTER — Other Ambulatory Visit: Payer: Self-pay | Admitting: Cardiology

## 2019-12-16 DIAGNOSIS — I6523 Occlusion and stenosis of bilateral carotid arteries: Secondary | ICD-10-CM

## 2019-12-30 ENCOUNTER — Other Ambulatory Visit: Payer: Self-pay

## 2019-12-30 ENCOUNTER — Encounter: Payer: Self-pay | Admitting: Cardiology

## 2019-12-30 ENCOUNTER — Ambulatory Visit: Payer: Medicare Other | Admitting: Cardiology

## 2019-12-30 VITALS — BP 131/63 | HR 58 | Resp 16 | Ht 67.0 in | Wt 188.0 lb

## 2019-12-30 DIAGNOSIS — I10 Essential (primary) hypertension: Secondary | ICD-10-CM

## 2019-12-30 DIAGNOSIS — I6523 Occlusion and stenosis of bilateral carotid arteries: Secondary | ICD-10-CM | POA: Diagnosis not present

## 2019-12-30 DIAGNOSIS — E7800 Pure hypercholesterolemia, unspecified: Secondary | ICD-10-CM

## 2019-12-30 DIAGNOSIS — I452 Bifascicular block: Secondary | ICD-10-CM | POA: Diagnosis not present

## 2019-12-30 MED ORDER — EZETIMIBE 10 MG PO TABS: 10.0000 mg | ORAL_TABLET | Freq: Every day | ORAL | 2 refills | Status: DC

## 2019-12-30 NOTE — Progress Notes (Signed)
Caucasian female  Primary Physician/Referring:  Peggy Pepper, MD  Patient ID: Peggy [name], female    DOB: 1934-05-04, 85 y.o.   MRN: [id number]  Chief Complaint  Patient presents with  . Carotis Stenosis  . Hyperlipidemia  . Bifascicular block  . Follow-up    6 months   HPI:    Peggy [name]  is a 85 y.o. Caucasian female with very mild aortic stenosis, hypertension, peripheral neuropathy, bifascicular block on EKG and bradycardia, asymptomatic bilateral carotid stenosis and history of acute stroke s/p right ICA stenting is presently on the program long-term,  presents here for 28-month office visit.  Fortunately she is remained asymptomatic, continues to remain active, she has not had any recurrence of TIA-like symptoms, no dizziness or syncope. or symptoms suggestive of bradycardia related fatigue or syncope. She is active.  Past Medical History:  Diagnosis Date  . Aortic stenosis    trace AS 05/31/14 echo (Dr. Einar Gip)  . Arthritis   . Dysrhythmia   . Hyperlipidemia   . Hypertension   . Mitral valve prolapse    takes antiabiotic with procedures  . Neuropathy    in feet  . Osteoarthritis of left shoulder, primary localized 04/22/2014  . Primary localized osteoarthrosis of right shoulder 11/29/2014  . Stroke ([place]) 04/23/2014   pt. states that she has no deficits.   . Urinary urgency   . Wears glasses    Past Surgical History:  Procedure Laterality Date  . ANGIOPLASTY    . BACK SURGERY     L5 removed 1986  . CARPAL TUNNEL RELEASE Right 2014  . COLONOSCOPY    . JOINT REPLACEMENT     bilat knees  . THROMBECTOMY    . TOTAL SHOULDER ARTHROPLASTY Left 04/22/2014   Procedure: LEFT TOTAL SHOULDER ARTHROPLASTY;  Surgeon: Marchia Bond, MD;  Location: [place];  Service: Orthopedics;  Laterality: Left;  . TOTAL SHOULDER ARTHROPLASTY Right 11/29/2014   Procedure: TOTAL SHOULDER ARTHROPLASTY;  Surgeon: Marchia Bond, MD;  Location: [facility];  Service:  Orthopedics;  Laterality: Right;   Family History  Problem Relation Age of Onset  . Diabetes Mother   . Cancer Mother   . Heart failure Father   . Stroke Maternal Grandmother   . Cancer Maternal Grandfather        unknown  . Stroke Paternal Grandmother    Social History   Tobacco Use  . Smoking status: Never Smoker  . Smokeless tobacco: Never Used  Substance Use Topics  . Alcohol use: Yes    Alcohol/week: 0.0 standard drinks    Comment: occasionally   Marital Status: Married ROS  Review of Systems  Cardiovascular: Negative for dyspnea on exertion, leg swelling and syncope.  Musculoskeletal: Positive for back pain and joint pain (multiple joints). Negative for joint swelling.  Gastrointestinal: Negative for melena.  Psychiatric/Behavioral: Negative for depression and substance abuse.  All other systems reviewed and are negative.  Objective  Blood pressure 131/63, pulse (!) 58, resp. rate 16, height $RemoveBe'5\' 7"'cOhIYnsib$  (1.702 m), weight 188 lb (85.3 kg), SpO2 95 %.  Vitals with BMI 12/30/2019 06/28/2019 12/28/2018  Height $Remov'5\' 7"'PETkVh$  $Remove'5\' 7"'hBMPoPL$  $RemoveB'5\' 7"'IuJPGDVy$   Weight 188 lbs 189 lbs 184 lbs  BMI 29.44 37.62 83.15  Systolic 176 160 737  Diastolic 63 60 64  Pulse 58 55 58      Physical Exam Constitutional:      General: She is not in acute distress.    Appearance: She is well-developed.  Neck:  Thyroid: No thyromegaly.  Cardiovascular:     Rate and Rhythm: Normal rate and regular rhythm.     Pulses:          Carotid pulses are 2+ on the right side with bruit and 2+ on the left side with bruit.      Femoral pulses are 2+ on the right side and 2+ on the left side.      Popliteal pulses are 2+ on the right side and 2+ on the left side.       Dorsalis pedis pulses are 1+ on the right side and 1+ on the left side.       Posterior tibial pulses are 0 on the right side and 0 on the left side.     Heart sounds: S1 normal and S2 normal. Murmur heard.   Harsh midsystolic murmur is present with a grade of  3/6 at the upper right sternal border and apex radiating to the neck. No gallop.      Comments: No JVD, No leg edema.  Pulmonary:     Effort: Pulmonary effort is normal.     Breath sounds: Normal breath sounds.  Abdominal:     General: Bowel sounds are normal.     Palpations: Abdomen is soft.  Musculoskeletal:        General: Normal range of motion.    Laboratory examination:   External Labs:   Cholesterol, total 142.000 m 05/19/2019 HDL 41.000 mg 05/19/2019 LDL-C 79.000 mg 05/19/2019 Triglycerides 120.000 m 05/19/2019  A1C 5.800 % 05/19/2019  Hemoglobin 14.600 g/d 05/19/2019-  Creatinine, Serum 0.780 mg/ 05/19/2019 Potassium 4.400 mm 05/19/2019 ALT (SGPT) 20.000 U/L 05/19/2019   Cholesterol, total 142.000 05/19/2019 Triglycerides 120.000 05/19/2019 HDL 41.000 05/19/2019 LDL-C 79.000 05/19/2019  A1C 5.800 05/19/2019  Glucose Random 120.000 05/19/2019 BUN 17.000 05/19/2019 Creatinine, Serum 0.780 05/19/2019  11/03/2017: A1c 5.9%. Serum glucose 96 g, BUN 19, creatinine 0.73, eGFR greater than 60 mL, BMP normal. Triglycerides 153, total cholesterol 163, HDL 42, LDL 90.  Medications and allergies  No Known Allergies   Current Outpatient Medications on File Prior to Visit  Medication Sig Dispense Refill  . atorvastatin (LIPITOR) 80 MG tablet Take 80 mg by mouth daily.    . calcium carbonate 200 MG capsule Take 250 mg by mouth 2 (two) times daily with a meal.    . cholecalciferol (VITAMIN D) 1000 UNITS tablet Take 500 Units by mouth daily.    . clopidogrel (PLAVIX) 75 MG tablet Take by mouth.    . docusate sodium (COLACE) 100 MG capsule Take 100 mg by mouth daily.    Marland Kitchen glucosamine-chondroitin 500-400 MG tablet Take 1 tablet by mouth 4 (four) times daily.    Marland Kitchen lisinopril (PRINIVIL,ZESTRIL) 20 MG tablet Take 20 mg by mouth daily.    . Multiple Vitamins-Minerals (SENIOR MULTIVITAMIN PLUS) TABS Take 1 tablet by mouth daily.     . Omega-3 Fatty Acids (FISH OIL) 1200 MG CPDR Take 1 capsule by mouth daily.      . pregabalin (LYRICA) 75 MG capsule Take 75 mg by mouth 2 (two) times daily.    Marland Kitchen triamcinolone (KENALOG) 0.025 % ointment Apply 1 application topically daily as needed.     No current facility-administered medications on file prior to visit.    Radiology:   CTA Head 12/04/2018: 1. Right carotid stent remains patent. There is narrowing of the lumen of the stent due to the calcified plaque resulting in about a 50% stenosis. This is unchanged  from the prior study. 2. 56% stenosis origin of the left internal carotid artery. 3. Normal intracranial CT angiogram.  Cardiac Studies:   Echocardiogram 11/19/2016: Left ventricle cavity is normal in size. Mild concentric hypertrophy of the left ventricle. Normal global wall motion. Visual EF is 50-55%. Doppler evidence of grade I (impaired) diastolic dysfunction, normal LAP. Mild aortic valve leaflet calcification with mildly restricted aortic valve leaflets. Mild aortic valve stenosis. Aortic valve mean gradient of 9 mmHg, Vmax of 2.1 m/s. Calculated aortic valve area by continuity equation is 1.4 cm. Mild tricuspid regurgitation. Pulmonary artery systolic pressure is estimated at 25-30 mm Hg. No significant change from prior study dated 05/31/2014.  Carotid artery duplex 06/21/2019:  Stenosis in the right internal carotid artery (50-69%). Stenosis in the right external carotid artery (<50%). Distal right ICA stent history and appears patent.  Stenosis in the left internal carotid artery (50-69%).  Antegrade right vertebral artery flow. Antegrade left vertebral artery flow.  Follow up in six months is appropriate if clinically indicated.  Echocardiogram 06/21/2019:  Frequent ectopy seen throughout the study.  Left ventricle cavity is normal in size. Mild concentric hypertrophy of the left ventricle. Normal global wall motion. Normal LV systolic function with EF 55%.  Doppler evidence of grade I (impaired) diastolic dysfunction, normal LAP.   Trileaflet aortic valve with mild calcification. Mild aortic valve stenosis. Aortic valve mean gradient of 10 mmHg, Vmax of 2.1 m/s.  Calculated aortic valve area by continuity equation is 1.4 cm. No regurgitation.  Mild tricuspid regurgitation. Estimated pulmonary artery systolic pressure is 22 mmHg.  No significant change compared to previous study on 11/19/2016.  EKG:   EKG 12/30/2019: Sinus rhythm with first-degree AV block at the rate of 61 bpm, left axis deviation, left anterior fascicular block.  Incomplete right bundle branch block.  Poor R wave progression, cannot exclude anterolateral infarct old.  Low voltage precordial leads, consider pulmonary disease pattern.  No significant change from 06/28/2019, frequent PVCs not present.  Assessment     ICD-10-CM   1. Asymptomatic bilateral carotid artery stenosis  I65.23 EKG 12-Lead  2. Bifascicular block  I45.2   3. Essential hypertension  I10   4. Hypercholesteremia  E78.00 ezetimibe (ZETIA) 10 MG tablet    Meds ordered this encounter  Medications  . ezetimibe (ZETIA) 10 MG tablet    Sig: Take 1 tablet (10 mg total) by mouth daily after supper.    Dispense:  30 tablet    Refill:  2   There are no discontinued medications.   Recommendations:   Peggy [name]  is a 85 y.o. Caucasian female with very mild aortic stenosis, hypertension, peripheral neuropathy, bifascicular block on EKG and bradycardia, asymptomatic bilateral carotid stenosis and history of acute stroke s/p right ICA stenting is presently on the program long-term,  presents here for 36-month office visit.    She is currently doing well and has not had any symptoms of marked unexplained fatigue or dizziness or near syncope, Bifascicular block is remained stable.  With regard to carotid stenosis, I reviewed the results of the carotid artery duplex, she will continue on 6 monthly surveillance for now.  I reviewed the lipids from PCP, LDL is 79, I have added Zetia 10 mg  daily, she is scheduled for lipid profile testing in 2 to 3 weeks which should clearly help with improving LDL goal to <70..   With regard to hypertension and blood pressure is well-controlled.    Adrian Prows, MD, [facility] 12/30/2019,  9:34 AM Office: [phone] Pager: [phone]

## 2020-01-05 DIAGNOSIS — Z0000 Encounter for general adult medical examination without abnormal findings: Secondary | ICD-10-CM | POA: Diagnosis not present

## 2020-01-05 DIAGNOSIS — R7303 Prediabetes: Secondary | ICD-10-CM | POA: Diagnosis not present

## 2020-01-05 DIAGNOSIS — E7800 Pure hypercholesterolemia, unspecified: Secondary | ICD-10-CM | POA: Diagnosis not present

## 2020-01-11 DIAGNOSIS — E785 Hyperlipidemia, unspecified: Secondary | ICD-10-CM | POA: Diagnosis not present

## 2020-01-11 DIAGNOSIS — I10 Essential (primary) hypertension: Secondary | ICD-10-CM | POA: Diagnosis not present

## 2020-01-11 DIAGNOSIS — G609 Hereditary and idiopathic neuropathy, unspecified: Secondary | ICD-10-CM | POA: Diagnosis not present

## 2020-01-11 DIAGNOSIS — Z0000 Encounter for general adult medical examination without abnormal findings: Secondary | ICD-10-CM | POA: Diagnosis not present

## 2020-01-11 DIAGNOSIS — R7303 Prediabetes: Secondary | ICD-10-CM | POA: Diagnosis not present

## 2020-01-11 DIAGNOSIS — N3941 Urge incontinence: Secondary | ICD-10-CM | POA: Diagnosis not present

## 2020-01-11 DIAGNOSIS — Z8673 Personal history of transient ischemic attack (TIA), and cerebral infarction without residual deficits: Secondary | ICD-10-CM | POA: Diagnosis not present

## 2020-01-17 NOTE — Progress Notes (Signed)
Labs 01/05/2020:  Glucose 98 mg, BUN 13, creatinine 0.80, EGFR 69 mL, potassium 4.6, sodium 143, CMP normal.  Total cholesterol 140, triglycerides 183, HDL 44, LDL 66.  A1c 5.8%.

## 2020-02-14 ENCOUNTER — Telehealth: Payer: Self-pay

## 2020-02-14 ENCOUNTER — Other Ambulatory Visit: Payer: Self-pay | Admitting: Cardiology

## 2020-02-14 DIAGNOSIS — E7800 Pure hypercholesterolemia, unspecified: Secondary | ICD-10-CM

## 2020-02-14 MED ORDER — EZETIMIBE 10 MG PO TABS: 10.0000 mg | ORAL_TABLET | Freq: Every day | ORAL | 3 refills | Status: DC

## 2020-02-14 NOTE — Telephone Encounter (Signed)
Patient called and said that you told he to call after 30 days of taking zetia, should she continue?

## 2020-02-14 NOTE — Telephone Encounter (Signed)
Labs 01/05/2020:  Glucose 98 mg, BUN 13, creatinine 0.80, EGFR 69 mL, potassium 4.6, sodium 143, CMP normal.  Total cholesterol 140, triglycerides 183, HDL 44, LDL 66.  A1c 5.8%.  Her cholesterol is now excellent. I sent in 90 day supply for a year to mail order pharmacy

## 2020-02-15 ENCOUNTER — Other Ambulatory Visit: Payer: Self-pay

## 2020-02-15 DIAGNOSIS — E7800 Pure hypercholesterolemia, unspecified: Secondary | ICD-10-CM

## 2020-02-15 MED ORDER — EZETIMIBE 10 MG PO TABS: 10.0000 mg | ORAL_TABLET | Freq: Every day | ORAL | 3 refills | Status: DC

## 2020-03-27 DIAGNOSIS — Z23 Encounter for immunization: Secondary | ICD-10-CM | POA: Diagnosis not present

## 2020-04-18 DIAGNOSIS — H2513 Age-related nuclear cataract, bilateral: Secondary | ICD-10-CM | POA: Diagnosis not present

## 2020-04-18 DIAGNOSIS — H04123 Dry eye syndrome of bilateral lacrimal glands: Secondary | ICD-10-CM | POA: Diagnosis not present

## 2020-04-18 DIAGNOSIS — H53042 Amblyopia suspect, left eye: Secondary | ICD-10-CM | POA: Diagnosis not present

## 2020-06-29 ENCOUNTER — Other Ambulatory Visit: Payer: Self-pay

## 2020-06-29 ENCOUNTER — Ambulatory Visit: Payer: Medicare Other

## 2020-06-29 DIAGNOSIS — I6523 Occlusion and stenosis of bilateral carotid arteries: Secondary | ICD-10-CM | POA: Diagnosis not present

## 2020-07-13 NOTE — Progress Notes (Signed)
Carotid artery duplex 06/29/2020: Stenosis in the right internal carotid artery (50-69%). Stenosis in the right external carotid artery (<50%). Distal right ICA stent history and appears patent. Stenosis in the left internal carotid artery (50-69%). Antegrade right vertebral artery flow. Antegrade left vertebral artery flow. No significant change since 12/15/2019. Follow up in six months is appropriate if clinically indicated.  Please inform the patient that there is no change from about a 50 to 60% stenosis in both the carotid arteries.  She has an appointment to see me in 6 months, please repeat carotid duplex prior to her next office visit, orders have been placed, let the front desk know.

## 2020-07-14 NOTE — Progress Notes (Signed)
Called and spoke with patient regarding her CAD results. She already has appointment for next one scheduled.

## 2020-07-24 DIAGNOSIS — I679 Cerebrovascular disease, unspecified: Secondary | ICD-10-CM | POA: Diagnosis not present

## 2020-07-24 DIAGNOSIS — E7800 Pure hypercholesterolemia, unspecified: Secondary | ICD-10-CM | POA: Diagnosis not present

## 2020-07-24 DIAGNOSIS — G609 Hereditary and idiopathic neuropathy, unspecified: Secondary | ICD-10-CM | POA: Diagnosis not present

## 2020-07-24 DIAGNOSIS — I10 Essential (primary) hypertension: Secondary | ICD-10-CM | POA: Diagnosis not present

## 2020-07-24 DIAGNOSIS — R7303 Prediabetes: Secondary | ICD-10-CM | POA: Diagnosis not present

## 2020-08-29 DIAGNOSIS — B353 Tinea pedis: Secondary | ICD-10-CM | POA: Diagnosis not present

## 2020-08-29 DIAGNOSIS — L300 Nummular dermatitis: Secondary | ICD-10-CM | POA: Diagnosis not present

## 2020-08-29 DIAGNOSIS — L821 Other seborrheic keratosis: Secondary | ICD-10-CM | POA: Diagnosis not present

## 2020-08-29 DIAGNOSIS — D485 Neoplasm of uncertain behavior of skin: Secondary | ICD-10-CM | POA: Diagnosis not present

## 2020-08-29 DIAGNOSIS — L814 Other melanin hyperpigmentation: Secondary | ICD-10-CM | POA: Diagnosis not present

## 2020-08-29 DIAGNOSIS — L578 Other skin changes due to chronic exposure to nonionizing radiation: Secondary | ICD-10-CM | POA: Diagnosis not present

## 2020-08-29 DIAGNOSIS — L723 Sebaceous cyst: Secondary | ICD-10-CM | POA: Diagnosis not present

## 2020-08-29 DIAGNOSIS — D225 Melanocytic nevi of trunk: Secondary | ICD-10-CM | POA: Diagnosis not present

## 2020-08-29 DIAGNOSIS — L89301 Pressure ulcer of unspecified buttock, stage 1: Secondary | ICD-10-CM | POA: Diagnosis not present

## 2020-10-09 DIAGNOSIS — Z23 Encounter for immunization: Secondary | ICD-10-CM | POA: Diagnosis not present

## 2020-10-11 DIAGNOSIS — R32 Unspecified urinary incontinence: Secondary | ICD-10-CM | POA: Diagnosis not present

## 2020-10-11 DIAGNOSIS — N8111 Cystocele, midline: Secondary | ICD-10-CM | POA: Diagnosis not present

## 2020-10-11 DIAGNOSIS — N3941 Urge incontinence: Secondary | ICD-10-CM | POA: Diagnosis not present

## 2020-10-11 DIAGNOSIS — R350 Frequency of micturition: Secondary | ICD-10-CM | POA: Diagnosis not present

## 2020-10-11 DIAGNOSIS — N952 Postmenopausal atrophic vaginitis: Secondary | ICD-10-CM | POA: Diagnosis not present

## 2020-11-30 DIAGNOSIS — N812 Incomplete uterovaginal prolapse: Secondary | ICD-10-CM | POA: Diagnosis not present

## 2020-11-30 DIAGNOSIS — R351 Nocturia: Secondary | ICD-10-CM | POA: Diagnosis not present

## 2020-11-30 DIAGNOSIS — N3281 Overactive bladder: Secondary | ICD-10-CM | POA: Diagnosis not present

## 2020-11-30 DIAGNOSIS — Z7409 Other reduced mobility: Secondary | ICD-10-CM | POA: Diagnosis not present

## 2020-11-30 DIAGNOSIS — K5901 Slow transit constipation: Secondary | ICD-10-CM | POA: Diagnosis not present

## 2020-12-14 ENCOUNTER — Other Ambulatory Visit: Payer: Self-pay

## 2020-12-14 ENCOUNTER — Encounter: Payer: Self-pay | Admitting: Cardiology

## 2020-12-14 ENCOUNTER — Ambulatory Visit: Payer: Medicare Other | Admitting: Cardiology

## 2020-12-14 VITALS — BP 137/60 | HR 68 | Temp 98.7°F | Resp 17 | Ht 67.0 in | Wt 194.0 lb

## 2020-12-14 DIAGNOSIS — I10 Essential (primary) hypertension: Secondary | ICD-10-CM

## 2020-12-14 DIAGNOSIS — I452 Bifascicular block: Secondary | ICD-10-CM | POA: Diagnosis not present

## 2020-12-14 DIAGNOSIS — I6523 Occlusion and stenosis of bilateral carotid arteries: Secondary | ICD-10-CM

## 2020-12-14 DIAGNOSIS — I6522 Occlusion and stenosis of left carotid artery: Secondary | ICD-10-CM

## 2020-12-14 NOTE — Progress Notes (Signed)
Caucasian female  Primary Physician/Referring:  London Pepper, MD  Patient ID: Peggy [name], female    DOB: June 23, 1934, 86 y.o.   MRN: [id number]  Chief Complaint  Patient presents with   Hypertension   Hyperlipidemia   Follow-up    1 year   HPI:    Trinadee Verhagen  is a 86 y.o. Caucasian female with very mild aortic stenosis, hypertension, peripheral neuropathy, bifascicular block on EKG and bradycardia, asymptomatic bilateral carotid stenosis and history of acute stroke s/p right ICA stenting is presently on clopidogrel long-term,  presents here for annual office visit.  Fortunately she is remained asymptomatic, continues to remain active, she has not had any recurrence of TIA-like symptoms, no dizziness or syncope. or symptoms suggestive of bradycardia related fatigue or syncope.  She is active.  Past Medical History:  Diagnosis Date   Aortic stenosis    trace AS 05/31/14 echo (Dr. Einar Gip)   Arthritis    Dysrhythmia    Hyperlipidemia    Hypertension    Mitral valve prolapse    takes antiabiotic with procedures   Neuropathy    in feet   Osteoarthritis of left shoulder, primary localized 04/22/2014   Primary localized osteoarthrosis of right shoulder 11/29/2014   Stroke ([place]) 04/23/2014   pt. states that she has no deficits.    Urinary urgency    Wears glasses    Past Surgical History:  Procedure Laterality Date   ANGIOPLASTY     BACK SURGERY     L5 removed 1986   CARPAL TUNNEL RELEASE Right 2014   COLONOSCOPY     JOINT REPLACEMENT     bilat knees   THROMBECTOMY     TOTAL SHOULDER ARTHROPLASTY Left 04/22/2014   Procedure: LEFT TOTAL SHOULDER ARTHROPLASTY;  Surgeon: Marchia Bond, MD;  Location: [facility];  Service: Orthopedics;  Laterality: Left;   TOTAL SHOULDER ARTHROPLASTY Right 11/29/2014   Procedure: TOTAL SHOULDER ARTHROPLASTY;  Surgeon: Marchia Bond, MD;  Location: [facility];  Service: Orthopedics;  Laterality: Right;   Family History  Problem  Relation Age of Onset   Diabetes Mother    Cancer Mother    Heart failure Father    Stroke Maternal Grandmother    Cancer Maternal Grandfather        unknown   Stroke Paternal Grandmother    Social History   Tobacco Use   Smoking status: Never   Smokeless tobacco: Never  Substance Use Topics   Alcohol use: Yes    Alcohol/week: 0.0 standard drinks    Comment: occasionally   Marital Status: Married ROS  Review of Systems  Cardiovascular:  Negative for dyspnea on exertion, leg swelling and syncope.  Musculoskeletal:  Positive for back pain and joint pain (multiple joints). Negative for joint swelling.  Gastrointestinal:  Negative for melena.  Psychiatric/Behavioral:  Negative for depression and substance abuse.   All other systems reviewed and are negative. Objective  Blood pressure 137/60, pulse 68, temperature 98.7 F (37.1 C), temperature source Temporal, resp. rate 17, height _0  (1.702 m), weight 194 lb (88 kg), SpO2 95 %.  Vitals with BMI 12/14/2020 12/14/2020 12/30/2019  Height - _1  _2   Weight - 194 lbs 188 lbs  BMI - 62.95 28.41  Systolic 324 401 027  Diastolic 60 64 63  Pulse 68 67 58    Physical Exam Constitutional:      General: She is not in acute distress.    Appearance: She is well-developed.  Neck:  Thyroid: No thyromegaly.  Cardiovascular:     Rate and Rhythm: Normal rate and regular rhythm.     Pulses:          Carotid pulses are 2+ on the right side with bruit and 2+ on the left side with bruit.      Femoral pulses are 2+ on the right side and 2+ on the left side.      Popliteal pulses are 2+ on the right side and 2+ on the left side.       Dorsalis pedis pulses are 1+ on the right side and 1+ on the left side.       Posterior tibial pulses are 0 on the right side and 0 on the left side.     Heart sounds: S1 normal and S2 normal. Murmur heard.  Harsh midsystolic murmur is present with a grade of 3/6 at the upper right sternal border and  apex radiating to the neck.    No gallop.     Comments: No JVD, No leg edema.  Pulmonary:     Effort: Pulmonary effort is normal.     Breath sounds: Normal breath sounds.  Abdominal:     General: Bowel sounds are normal.     Palpations: Abdomen is soft.  Musculoskeletal:        General: Normal range of motion.   Laboratory examination:   External Labs:  Labs 01/05/2020:   Glucose 98 mg, BUN 13, creatinine 0.80, EGFR 69 mL, potassium 4.6, sodium 143, CMP normal.   Total cholesterol 140, triglycerides 183, HDL 44, LDL 66.   A1c 5.8%.  Cholesterol, total 142.000 m 05/19/2019 HDL 41.000 mg 05/19/2019 LDL-C 79.000 mg 05/19/2019 Triglycerides 120.000 m 05/19/2019  A1C 5.800 % 05/19/2019  Medications and allergies  No Known Allergies   Current Outpatient Medications on File Prior to Visit  Medication Sig Dispense Refill   atorvastatin (LIPITOR) 80 MG tablet Take 80 mg by mouth daily.     calcium carbonate 200 MG capsule Take 250 mg by mouth 2 (two) times daily with a meal.     cholecalciferol (VITAMIN D) 1000 UNITS tablet Take 500 Units by mouth daily.     clopidogrel (PLAVIX) 75 MG tablet Take by mouth.     docusate sodium (COLACE) 100 MG capsule Take 100 mg by mouth daily.     ezetimibe (ZETIA) 10 MG tablet Take 1 tablet (10 mg total) by mouth daily after supper. 90 tablet 3   glucosamine-chondroitin 500-400 MG tablet Take 1 tablet by mouth 4 (four) times daily.     lisinopril (PRINIVIL,ZESTRIL) 20 MG tablet Take 20 mg by mouth daily.     Multiple Vitamins-Minerals (SENIOR MULTIVITAMIN PLUS) TABS Take 1 tablet by mouth daily.      Omega-3 Fatty Acids (FISH OIL) 1200 MG CPDR Take 1 capsule by mouth daily.      pregabalin (LYRICA) 75 MG capsule Take 75 mg by mouth 2 (two) times daily.     triamcinolone (KENALOG) 0.025 % ointment Apply 1 application topically daily as needed.     No current facility-administered medications on file prior to visit.    Radiology:   CTA Head  12/04/2018: 1.  Right carotid stent remains patent. There is narrowing of the lumen of the stent due to the calcified plaque resulting in about a 50% stenosis. This is unchanged from the prior study. 2.  56% stenosis origin of the left internal carotid artery. 3.  Normal intracranial CT angiogram.  Cardiac Studies:   Echocardiogram 06/21/2019:  Frequent ectopy seen throughout the study.  Left ventricle cavity is normal in size. Mild concentric hypertrophy of the left ventricle. Normal global wall motion. Normal LV systolic function with EF 55%.  Doppler evidence of grade I (impaired) diastolic dysfunction, normal LAP.  Trileaflet aortic valve with mild calcification. Mild aortic valve stenosis. Aortic valve mean gradient of 10 mmHg, Vmax of 2.1 m/s.  Calculated aortic valve area by continuity equation is 1.4 cm. No regurgitation.  Mild tricuspid regurgitation. Estimated pulmonary artery systolic pressure is 22 mmHg.  No significant change compared to previous study on 11/19/2016.  Carotid artery duplex 06/29/2020: Stenosis in the right internal carotid artery (50-69%). Stenosis in the right external carotid artery (<50%). Distal right ICA stent history and appears patent.  Stenosis in the left internal carotid artery (50-69%). Antegrade right vertebral artery flow. Antegrade left vertebral artery flow. No significant change since 12/15/2019. Follow up in six months is appropriate if clinically indicated.  EKG:  EKG 12/14/2020: Sinus rhythm with first-degree AV block at rate of 69 bpm, left axis deviation, left anterior fascicular block.  Poor R wave progression, cannot exclude anteroseptal infarct old.  No evidence of ischemia, normal QT interval.  Low-voltage complexes, consider pulmonary disease pattern.  No significant change from 12/30/2019.   Assessment     ICD-10-CM   1. Essential hypertension  I10 EKG 12-Lead    2. Asymptomatic bilateral carotid artery stenosis  I65.23     3.  Asymptomatic stenosis of left carotid artery. H/O Right ICA stent 2016  I65.22     4. Bifascicular block  I45.2       No orders of the defined types were placed in this encounter.  There are no discontinued medications.   Recommendations:   Kyasia Steuck  is a 86 y.o. Caucasian female with very mild aortic stenosis, hypertension, peripheral neuropathy, bifascicular block on EKG and bradycardia, asymptomatic bilateral carotid stenosis and history of acute stroke s/p right ICA stenting is presently on the program long-term,  presents here for 39-monthoffice visit.    She is currently doing well and has not had any symptoms of marked unexplained fatigue or dizziness or near syncope, Bifascicular block is remained stable.  She and her husband enjoy traveling, during [place]  [place]  With regard to carotid stenosis, I reviewed the results of the carotid artery duplex, she will continue on 6 monthly surveillance for now.  I reviewed the lipids from PCP, LDL is at goal since addition of Zetia.  With regard to hypertension and blood pressure is well-controlled.  No change in physical exam, aortic stenotic murmur appears to be very stable as well.  I will see her back in a year, she will continue with carotid artery duplex surveillance.    JAdrian Prows MD, FS[facility]12/01/2020, 9:55 AM Office: [phone]: [phone]

## 2020-12-15 DIAGNOSIS — N3281 Overactive bladder: Secondary | ICD-10-CM | POA: Diagnosis not present

## 2020-12-15 DIAGNOSIS — R351 Nocturia: Secondary | ICD-10-CM | POA: Diagnosis not present

## 2020-12-15 DIAGNOSIS — N3941 Urge incontinence: Secondary | ICD-10-CM | POA: Diagnosis not present

## 2020-12-15 DIAGNOSIS — N952 Postmenopausal atrophic vaginitis: Secondary | ICD-10-CM | POA: Diagnosis not present

## 2020-12-15 DIAGNOSIS — N812 Incomplete uterovaginal prolapse: Secondary | ICD-10-CM | POA: Diagnosis not present

## 2021-02-16 ENCOUNTER — Other Ambulatory Visit: Payer: Medicare Other

## 2021-02-27 DIAGNOSIS — Z0000 Encounter for general adult medical examination without abnormal findings: Secondary | ICD-10-CM | POA: Diagnosis not present

## 2021-02-27 DIAGNOSIS — R7303 Prediabetes: Secondary | ICD-10-CM | POA: Diagnosis not present

## 2021-02-27 DIAGNOSIS — E7800 Pure hypercholesterolemia, unspecified: Secondary | ICD-10-CM | POA: Diagnosis not present

## 2021-02-27 DIAGNOSIS — Z5181 Encounter for therapeutic drug level monitoring: Secondary | ICD-10-CM | POA: Diagnosis not present

## 2021-03-01 ENCOUNTER — Ambulatory Visit: Payer: Medicare Other

## 2021-03-01 ENCOUNTER — Other Ambulatory Visit: Payer: Self-pay

## 2021-03-01 DIAGNOSIS — Z95828 Presence of other vascular implants and grafts: Secondary | ICD-10-CM | POA: Diagnosis not present

## 2021-03-01 DIAGNOSIS — I6523 Occlusion and stenosis of bilateral carotid arteries: Secondary | ICD-10-CM | POA: Diagnosis not present

## 2021-03-02 ENCOUNTER — Other Ambulatory Visit: Payer: Medicare Other

## 2021-03-05 DIAGNOSIS — E785 Hyperlipidemia, unspecified: Secondary | ICD-10-CM | POA: Diagnosis not present

## 2021-03-05 DIAGNOSIS — Z8673 Personal history of transient ischemic attack (TIA), and cerebral infarction without residual deficits: Secondary | ICD-10-CM | POA: Diagnosis not present

## 2021-03-05 DIAGNOSIS — Z0000 Encounter for general adult medical examination without abnormal findings: Secondary | ICD-10-CM | POA: Diagnosis not present

## 2021-03-05 DIAGNOSIS — G609 Hereditary and idiopathic neuropathy, unspecified: Secondary | ICD-10-CM | POA: Diagnosis not present

## 2021-03-05 DIAGNOSIS — N3941 Urge incontinence: Secondary | ICD-10-CM | POA: Diagnosis not present

## 2021-03-05 DIAGNOSIS — R7303 Prediabetes: Secondary | ICD-10-CM | POA: Diagnosis not present

## 2021-03-05 DIAGNOSIS — I10 Essential (primary) hypertension: Secondary | ICD-10-CM | POA: Diagnosis not present

## 2021-03-06 NOTE — Progress Notes (Signed)
Please let her know that the right and left carotid show about a 50 to 60% stenosis unchanged from previous, I will see her back in 6 months, Ashby Dawes will reschedule her appointment instead of December.

## 2021-03-06 NOTE — Progress Notes (Signed)
Please schedule her for carotid in 6 months, I will place orders.  Please change her appointment to come and see me couple weeks after, thank you

## 2021-03-16 DIAGNOSIS — N8111 Cystocele, midline: Secondary | ICD-10-CM | POA: Diagnosis not present

## 2021-03-16 DIAGNOSIS — N3281 Overactive bladder: Secondary | ICD-10-CM | POA: Diagnosis not present

## 2021-03-16 DIAGNOSIS — N952 Postmenopausal atrophic vaginitis: Secondary | ICD-10-CM | POA: Diagnosis not present

## 2021-03-16 NOTE — Progress Notes (Signed)
Called patient, NA, LMAM

## 2021-03-18 ENCOUNTER — Other Ambulatory Visit: Payer: Self-pay | Admitting: Cardiology

## 2021-03-18 DIAGNOSIS — E7800 Pure hypercholesterolemia, unspecified: Secondary | ICD-10-CM

## 2021-03-20 NOTE — Progress Notes (Signed)
Called and spoke to pt. Pt voiced understanding.

## 2021-04-24 DIAGNOSIS — H52223 Regular astigmatism, bilateral: Secondary | ICD-10-CM | POA: Diagnosis not present

## 2021-04-24 DIAGNOSIS — H53042 Amblyopia suspect, left eye: Secondary | ICD-10-CM | POA: Diagnosis not present

## 2021-04-24 DIAGNOSIS — H5203 Hypermetropia, bilateral: Secondary | ICD-10-CM | POA: Diagnosis not present

## 2021-04-24 DIAGNOSIS — H04123 Dry eye syndrome of bilateral lacrimal glands: Secondary | ICD-10-CM | POA: Diagnosis not present

## 2021-04-24 DIAGNOSIS — H2513 Age-related nuclear cataract, bilateral: Secondary | ICD-10-CM | POA: Diagnosis not present

## 2021-08-27 ENCOUNTER — Other Ambulatory Visit: Payer: Medicare Other

## 2021-08-29 ENCOUNTER — Other Ambulatory Visit: Payer: Medicare Other

## 2021-08-29 ENCOUNTER — Ambulatory Visit: Payer: Medicare Other

## 2021-08-29 DIAGNOSIS — L578 Other skin changes due to chronic exposure to nonionizing radiation: Secondary | ICD-10-CM | POA: Diagnosis not present

## 2021-08-29 DIAGNOSIS — I6523 Occlusion and stenosis of bilateral carotid arteries: Secondary | ICD-10-CM | POA: Diagnosis not present

## 2021-08-29 DIAGNOSIS — L720 Epidermal cyst: Secondary | ICD-10-CM | POA: Diagnosis not present

## 2021-08-29 DIAGNOSIS — D225 Melanocytic nevi of trunk: Secondary | ICD-10-CM | POA: Diagnosis not present

## 2021-08-29 DIAGNOSIS — L821 Other seborrheic keratosis: Secondary | ICD-10-CM | POA: Diagnosis not present

## 2021-08-29 DIAGNOSIS — L723 Sebaceous cyst: Secondary | ICD-10-CM | POA: Diagnosis not present

## 2021-08-29 DIAGNOSIS — L89301 Pressure ulcer of unspecified buttock, stage 1: Secondary | ICD-10-CM | POA: Diagnosis not present

## 2021-08-29 DIAGNOSIS — Z95828 Presence of other vascular implants and grafts: Secondary | ICD-10-CM

## 2021-09-04 DIAGNOSIS — I679 Cerebrovascular disease, unspecified: Secondary | ICD-10-CM | POA: Diagnosis not present

## 2021-09-04 DIAGNOSIS — R7303 Prediabetes: Secondary | ICD-10-CM | POA: Diagnosis not present

## 2021-09-04 DIAGNOSIS — E7800 Pure hypercholesterolemia, unspecified: Secondary | ICD-10-CM | POA: Diagnosis not present

## 2021-09-04 DIAGNOSIS — I10 Essential (primary) hypertension: Secondary | ICD-10-CM | POA: Diagnosis not present

## 2021-09-04 DIAGNOSIS — G609 Hereditary and idiopathic neuropathy, unspecified: Secondary | ICD-10-CM | POA: Diagnosis not present

## 2021-09-05 DIAGNOSIS — Z23 Encounter for immunization: Secondary | ICD-10-CM | POA: Diagnosis not present

## 2021-09-10 ENCOUNTER — Encounter: Payer: Self-pay | Admitting: Cardiology

## 2021-09-10 ENCOUNTER — Ambulatory Visit: Payer: Medicare Other | Admitting: Cardiology

## 2021-09-10 VITALS — BP 110/73 | HR 60 | Temp 98.1°F | Resp 16 | Ht 67.0 in | Wt 195.8 lb

## 2021-09-10 DIAGNOSIS — I10 Essential (primary) hypertension: Secondary | ICD-10-CM | POA: Diagnosis not present

## 2021-09-10 DIAGNOSIS — I6523 Occlusion and stenosis of bilateral carotid arteries: Secondary | ICD-10-CM

## 2021-09-10 DIAGNOSIS — Z95828 Presence of other vascular implants and grafts: Secondary | ICD-10-CM | POA: Diagnosis not present

## 2021-09-10 DIAGNOSIS — E7800 Pure hypercholesterolemia, unspecified: Secondary | ICD-10-CM | POA: Diagnosis not present

## 2021-09-10 DIAGNOSIS — Z9889 Other specified postprocedural states: Secondary | ICD-10-CM | POA: Diagnosis not present

## 2021-09-10 NOTE — Progress Notes (Signed)
Caucasian female  Primary Physician/Referring:  London Pepper, MD  Patient ID: Peggy [name], female    DOB: [birth year], 87 y.o.   MRN: [id number]  Chief Complaint  Patient presents with   Asymptomatic Carotid Stenosis   Hyperlipidemia   Hypertension   Follow-up    1 year   HPI:    Peggy [name]  is a 87 y.o. Caucasian female with very mild aortic stenosis, hypertension, peripheral neuropathy, bifascicular block on EKG and bradycardia, asymptomatic bilateral carotid stenosis and history of acute stroke s/p right ICA stenting is presently on clopidogrel long-term,  presents here for annual office visit.  Fortunately she is remained asymptomatic, continues to remain active, she has not had any recurrence of TIA-like symptoms, no dizziness or syncope.  She and her husband has now moved into [facility] assisted living place.  Past Medical History:  Diagnosis Date   Aortic stenosis    trace AS 05/31/14 echo (Dr. Einar [name])   Arthritis    Dysrhythmia    Hyperlipidemia    Hypertension    Mitral valve prolapse    takes antiabiotic with procedures   Neuropathy    in feet   Osteoarthritis of left shoulder, primary localized 04/22/2014   Primary localized osteoarthrosis of right shoulder 11/29/2014   Stroke ([place]) 04/23/2014   pt. states that she has no deficits.    Urinary urgency    Wears glasses    Past Surgical History:  Procedure Laterality Date   ANGIOPLASTY     BACK SURGERY     L5 removed 1986   CARPAL TUNNEL RELEASE Right 2014   COLONOSCOPY     JOINT REPLACEMENT     bilat knees   THROMBECTOMY     TOTAL SHOULDER ARTHROPLASTY Left 04/22/2014   Procedure: LEFT TOTAL SHOULDER ARTHROPLASTY;  Surgeon: Marchia Bond, MD;  Location: [facility];  Service: Orthopedics;  Laterality: Left;   TOTAL SHOULDER ARTHROPLASTY Right 11/29/2014   Procedure: TOTAL SHOULDER ARTHROPLASTY;  Surgeon: Marchia Bond, MD;  Location: [facility];  Service: Orthopedics;  Laterality: Right;    Family History  Problem Relation Age of Onset   Diabetes Mother    Cancer Mother    Heart failure Father    Stroke Maternal Grandmother    Cancer Maternal Grandfather        unknown   Stroke Paternal Grandmother    Social History   Tobacco Use   Smoking status: Never   Smokeless tobacco: Never  Substance Use Topics   Alcohol use: Yes    Alcohol/week: 0.0 standard drinks of alcohol    Comment: occasionally   Marital Status: Married ROS  Review of Systems  Cardiovascular:  Negative for chest pain, dyspnea on exertion and leg swelling.   Objective  Blood pressure 110/73, pulse 60, temperature 98.1 F (36.7 C), temperature source Temporal, resp. rate 16, weight 195 lb 12.8 oz (88.8 kg), SpO2 94 %.     09/10/2021   10:03 AM 12/14/2020    9:00 AM 12/14/2020    8:53 AM  Vitals with BMI  Height   '5\' 7"'$   Weight 195 lbs 13 oz  194 lbs  BMI   78.46  Systolic 962 952 841  Diastolic 73 60 64  Pulse 60 68 67    Physical Exam Constitutional:      Appearance: She is well-developed.  Neck:     Thyroid: No thyromegaly.     Vascular: No JVD.  Cardiovascular:     Rate and Rhythm: Normal  rate and regular rhythm.     Pulses:          Carotid pulses are  on the right side with bruit and  on the left side with bruit.      Femoral pulses are 2+ on the right side and 2+ on the left side.      Popliteal pulses are 2+ on the right side and 2+ on the left side.       Dorsalis pedis pulses are 1+ on the right side and 1+ on the left side.       Posterior tibial pulses are 0 on the right side and 0 on the left side.     Heart sounds: S1 normal and S2 normal. Murmur heard.     Harsh midsystolic murmur is present with a grade of 3/6 at the upper right sternal border and apex radiating to the neck.     No gallop.  Pulmonary:     Effort: Pulmonary effort is normal.     Breath sounds: Normal breath sounds.  Abdominal:     General: Bowel sounds are normal.     Palpations: Abdomen is soft.   Musculoskeletal:     Right lower leg: No edema.     Left lower leg: No edema.    Laboratory examination:   External Labs:  Cholesterol, total 115.000 m 02/27/2021 HDL 42.000 mg 02/27/2021 LDL 50.000 mg 02/27/2021 Triglycerides 128.000 m 02/27/2021  A1C 6.100 % 09/04/2021 TSH 1.760 02/27/2021  Hemoglobin 14.600 g/d 09/04/2021  Creatinine, Serum 0.870 mg/ 09/04/2021 Potassium 4.600 mm 09/04/2021 ALT (SGPT) 18.000 U/L 09/04/2021   Medications and allergies  No Known Allergies    Current Outpatient Medications:    atorvastatin (LIPITOR) 80 MG tablet, Take 80 mg by mouth daily., Disp: , Rfl:    calcium carbonate 200 MG capsule, Take 250 mg by mouth 2 (two) times daily with a meal., Disp: , Rfl:    cholecalciferol (VITAMIN D) 1000 UNITS tablet, Take 500 Units by mouth daily., Disp: , Rfl:    clopidogrel (PLAVIX) 75 MG tablet, Take by mouth., Disp: , Rfl:    docusate sodium (COLACE) 100 MG capsule, Take 100 mg by mouth daily., Disp: , Rfl:    ezetimibe (ZETIA) 10 MG tablet, TAKE ONE TABLET BY MOUTH DAILY AFTER SUPPER, Disp: 90 tablet, Rfl: 3   glucosamine-chondroitin 500-400 MG tablet, Take 1 tablet by mouth 4 (four) times daily., Disp: , Rfl:    lisinopril (PRINIVIL,ZESTRIL) 20 MG tablet, Take 20 mg by mouth daily., Disp: , Rfl:    Multiple Vitamins-Minerals (SENIOR MULTIVITAMIN PLUS) TABS, Take 1 tablet by mouth daily. , Disp: , Rfl:    Omega-3 Fatty Acids (FISH OIL) 1200 MG CPDR, Take 1 capsule by mouth daily. , Disp: , Rfl:    pregabalin (LYRICA) 75 MG capsule, Take 75 mg by mouth 2 (two) times daily., Disp: , Rfl:    pregabalin (LYRICA) 75 MG capsule, Take 2-3 tablets by mouth daily as needed., Disp: , Rfl:    solifenacin (VESICARE) 5 MG tablet, Take 1 tablet by mouth at bedtime., Disp: , Rfl:    triamcinolone (KENALOG) 0.025 % ointment, Apply 1 application topically daily as needed., Disp: , Rfl:     Radiology:   CTA Head 12/04/2018: 1.  Right carotid stent remains patent. There  is narrowing of the lumen of the stent due to the calcified plaque resulting in about a 50% stenosis. This is unchanged from the prior study. 2.  56%  stenosis origin of the left internal carotid artery. 3.  Normal intracranial CT angiogram.  Cardiac Studies:   Echocardiogram 06/21/2019:  Frequent ectopy seen throughout the study.  Left ventricle cavity is normal in size. Mild concentric hypertrophy of the left ventricle. Normal global wall motion. Normal LV systolic function with EF 55%.  Doppler evidence of grade I (impaired) diastolic dysfunction, normal LAP.  Trileaflet aortic valve with mild calcification. Mild aortic valve stenosis. Aortic valve mean gradient of 10 mmHg, Vmax of 2.1 m/s.  Calculated aortic valve area by continuity equation is 1.4 cm. No regurgitation.  Mild tricuspid regurgitation. Estimated pulmonary artery systolic pressure is 22 mmHg.  No significant change compared to previous study on 11/19/2016.  Carotid artery duplex 08/29/2021: Duplex suggests stenosis in the right internal carotid artery (50-69%). Duplex suggests stenosis in the right external carotid artery (<50%). There is instent restenosis of the right ICA stent. Stable  Duplex suggests stenosis in the left internal carotid artery (50-69%). Thereis homogeneous plaque bilateral carotid arteries. Antegrade left vertebral artery flow. No significant change from 03/01/2021. Follow up in six months is appropriate if clinically indicated.  EKG:  EKG 09/10/2021: Sinus rhythm with first-degree AV block at rate of 63 bpm, right axis deviation, left posterior fascicular block.  Incomplete right bundle branch block.  Poor R progression, cannot exclude anterolateral infarct old.  No evidence of ischemia, normal QT interval. No significant change from 12/14/2020.   Assessment     ICD-10-CM   1. Primary hypertension  I10     2. History of right common carotid artery stent placement  Z98.890 PCV CAROTID DUPLEX  (BILATERAL)   Z95.828     3. Asymptomatic bilateral carotid artery stenosis  I65.23 EKG 12-Lead    PCV CAROTID DUPLEX (BILATERAL)    4. Hypercholesteremia  E78.00       No orders of the defined types were placed in this encounter.  There are no discontinued medications.   Recommendations:   Mauriana Dann  is a 87 y.o. Caucasian female with very mild aortic stenosis, hypertension, peripheral neuropathy, bifascicular block on EKG and bradycardia, asymptomatic bilateral carotid stenosis and history of acute stroke s/p right ICA stenting is here for 75-monthoffice visit.  I reviewed the results of the carotid artery duplex, no significant change in carotid stenosis.  Right ICA in-stent restenosis is also stable that was noted previously due to heavily calcified plaque during stenting.  Fortunately she has not had any TIA.  She is tolerating Plavix without any bleeding diathesis.  She is also on appropriate medical therapy for hyperlipidemia and lipids are well controlled. Labs reviewed.  Blood pressure is also well controlled, no changes in the medications were done today, we will recheck her carotid artery duplex in 6 months and see her back at that time.    JAdrian Prows MD, F[facility]8/28/2023, 9:10 PM Office: [phone]: [phone]

## 2021-12-14 ENCOUNTER — Ambulatory Visit: Payer: Medicare Other | Admitting: Cardiology

## 2022-03-07 ENCOUNTER — Other Ambulatory Visit: Payer: Medicare Other

## 2022-03-11 DIAGNOSIS — E785 Hyperlipidemia, unspecified: Secondary | ICD-10-CM | POA: Diagnosis not present

## 2022-03-11 DIAGNOSIS — R7303 Prediabetes: Secondary | ICD-10-CM | POA: Diagnosis not present

## 2022-03-11 DIAGNOSIS — Z0000 Encounter for general adult medical examination without abnormal findings: Secondary | ICD-10-CM | POA: Diagnosis not present

## 2022-03-11 DIAGNOSIS — I779 Disorder of arteries and arterioles, unspecified: Secondary | ICD-10-CM | POA: Diagnosis not present

## 2022-03-12 ENCOUNTER — Ambulatory Visit: Payer: Medicare Other

## 2022-03-12 ENCOUNTER — Other Ambulatory Visit: Payer: Self-pay | Admitting: Cardiology

## 2022-03-12 DIAGNOSIS — Z95828 Presence of other vascular implants and grafts: Secondary | ICD-10-CM | POA: Diagnosis not present

## 2022-03-12 DIAGNOSIS — I6523 Occlusion and stenosis of bilateral carotid arteries: Secondary | ICD-10-CM

## 2022-03-12 DIAGNOSIS — Z9889 Other specified postprocedural states: Secondary | ICD-10-CM | POA: Diagnosis not present

## 2022-03-12 DIAGNOSIS — E7800 Pure hypercholesterolemia, unspecified: Secondary | ICD-10-CM

## 2022-03-13 DIAGNOSIS — R7303 Prediabetes: Secondary | ICD-10-CM | POA: Diagnosis not present

## 2022-03-13 DIAGNOSIS — I10 Essential (primary) hypertension: Secondary | ICD-10-CM | POA: Diagnosis not present

## 2022-03-13 DIAGNOSIS — Z8673 Personal history of transient ischemic attack (TIA), and cerebral infarction without residual deficits: Secondary | ICD-10-CM | POA: Diagnosis not present

## 2022-03-13 DIAGNOSIS — Z0000 Encounter for general adult medical examination without abnormal findings: Secondary | ICD-10-CM | POA: Diagnosis not present

## 2022-03-13 DIAGNOSIS — E785 Hyperlipidemia, unspecified: Secondary | ICD-10-CM | POA: Diagnosis not present

## 2022-03-13 DIAGNOSIS — G609 Hereditary and idiopathic neuropathy, unspecified: Secondary | ICD-10-CM | POA: Diagnosis not present

## 2022-03-15 ENCOUNTER — Ambulatory Visit: Payer: Medicare Other | Admitting: Cardiology

## 2022-03-15 ENCOUNTER — Encounter: Payer: Self-pay | Admitting: Cardiology

## 2022-03-15 VITALS — BP 108/66 | HR 59 | Resp 16 | Ht 67.0 in | Wt 196.4 lb

## 2022-03-15 DIAGNOSIS — I350 Nonrheumatic aortic (valve) stenosis: Secondary | ICD-10-CM | POA: Diagnosis not present

## 2022-03-15 DIAGNOSIS — E7800 Pure hypercholesterolemia, unspecified: Secondary | ICD-10-CM | POA: Diagnosis not present

## 2022-03-15 DIAGNOSIS — I10 Essential (primary) hypertension: Secondary | ICD-10-CM

## 2022-03-15 DIAGNOSIS — I6523 Occlusion and stenosis of bilateral carotid arteries: Secondary | ICD-10-CM | POA: Diagnosis not present

## 2022-03-15 DIAGNOSIS — Z9889 Other specified postprocedural states: Secondary | ICD-10-CM | POA: Diagnosis not present

## 2022-03-15 DIAGNOSIS — Z95828 Presence of other vascular implants and grafts: Secondary | ICD-10-CM | POA: Diagnosis not present

## 2022-03-15 NOTE — Progress Notes (Signed)
Caucasian female  Primary Physician/Referring:  London Pepper, MD  Patient ID: Peggy [name], female    DOB: 24-May-1934, 88 y.o.   MRN: [id number]  Chief Complaint  Patient presents with   Aortic Stenosis   Carotid stenosis   Follow-up    6 months   HPI:    Peggy [name]  is a 88 y.o. Caucasian female with very mild aortic stenosis, hypertension, peripheral neuropathy, bifascicular block on EKG and bradycardia, asymptomatic bilateral carotid stenosis and history of acute stroke s/p right ICA stenting is presently on clopidogrel long-term,  presents here for annual office visit.  They have recently moved into [facility] assisted living facility.  Fortunately she is remained asymptomatic, continues to remain active, she has not had any recurrence of TIA-like symptoms, no dizziness or syncope.  She and her husband has now moved into [facility] assisted living place.  Past Medical History:  Diagnosis Date   Aortic stenosis    trace AS 05/31/14 echo (Dr. Einar Gip)   Arthritis    Dysrhythmia    Hyperlipidemia    Hypertension    Mitral valve prolapse    takes antiabiotic with procedures   Neuropathy    in feet   Osteoarthritis of left shoulder, primary localized 04/22/2014   Primary localized osteoarthrosis of right shoulder 11/29/2014   Stroke ([place]) 04/23/2014   pt. states that she has no deficits.    Urinary urgency    Wears glasses    Past Surgical History:  Procedure Laterality Date   ANGIOPLASTY     BACK SURGERY     L5 removed 1986   CARPAL TUNNEL RELEASE Right 2014   COLONOSCOPY     JOINT REPLACEMENT     bilat knees   THROMBECTOMY     TOTAL SHOULDER ARTHROPLASTY Left 04/22/2014   Procedure: LEFT TOTAL SHOULDER ARTHROPLASTY;  Surgeon: Marchia Bond, MD;  Location: [facility];  Service: Orthopedics;  Laterality: Left;   TOTAL SHOULDER ARTHROPLASTY Right 11/29/2014   Procedure: TOTAL SHOULDER ARTHROPLASTY;  Surgeon: Marchia Bond, MD;   Location: [place];  Service: Orthopedics;  Laterality: Right;   Family History  Problem Relation Age of Onset   Diabetes Mother    Cancer Mother    Heart failure Father    Stroke Maternal Grandmother    Cancer Maternal Grandfather        unknown   Stroke Paternal Grandmother    Social History   Tobacco Use   Smoking status: Never   Smokeless tobacco: Never  Substance Use Topics   Alcohol use: Yes    Alcohol/week: 0.0 standard drinks of alcohol    Comment: occasionally   Marital Status: Married ROS  Review of Systems  Cardiovascular:  Negative for chest pain, dyspnea on exertion and leg swelling.   Objective  Blood pressure 108/66, pulse (!) 59, resp. rate 16, height '5\' 7"'$  (1.702 m), weight 196 lb 6.4 oz (89.1 kg), SpO2 95 %.     03/15/2022    1:53 PM 09/10/2021   10:03 AM 12/14/2020    9:00 AM  Vitals with BMI  Height '5\' 7"'$  '5\' 7"'$    Weight 196 lbs 6 oz 195 lbs 13 oz   BMI [id number]   Systolic [id number]  Diastolic 66 73 60  Pulse 59 60 68    Physical Exam Constitutional:      Appearance: She is well-developed.  Neck:     Thyroid: No thyromegaly.     Vascular:  Carotid bruit (bilateral) present. No JVD.  Cardiovascular:     Rate and Rhythm: Normal rate and regular rhythm.     Pulses:          Popliteal pulses are 2+ on the right side and 2+ on the left side.       Dorsalis pedis pulses are 1+ on the right side and 1+ on the left side.       Posterior tibial pulses are 0 on the right side and 0 on the left side.     Heart sounds: S1 normal and S2 normal. Murmur heard.     Harsh midsystolic murmur is present with a grade of 3/6 at the upper right sternal border and apex radiating to the neck.     No gallop.  Pulmonary:     Effort: Pulmonary effort is normal.     Breath sounds: Normal breath sounds.  Abdominal:     General: Bowel sounds are normal.     Palpations: Abdomen is soft.  Musculoskeletal:     Right lower leg: No edema.     Left lower leg: No edema.     Laboratory examination:   External Labs:  Labs 03/13/2022:  Total cholesterol 117, triglycerides 115, HDL 45, LDL 51.  Serum glucose 104 mg, BUN 17, creatinine 0.91, EGFR 67, potassium 4.5, LFTs normal.  Hb 14.7/HCT 44.3, platelets 261, normal indicis.  A1c 6.0%.  Cholesterol, total 115.000 m 02/27/2021 HDL 42.000 mg 02/27/2021 LDL 50.000 mg 02/27/2021 Triglycerides 128.000 m 02/27/2021  A1C 6.100 % 09/04/2021 TSH 1.760 02/27/2021  Medications and allergies  No Known Allergies    Current Outpatient Medications:    atorvastatin (LIPITOR) 80 MG tablet, Take 80 mg by mouth daily., Disp: , Rfl:    calcium carbonate 200 MG capsule, Take 250 mg by mouth 2 (two) times daily with a meal., Disp: , Rfl:    cholecalciferol (VITAMIN D) 1000 UNITS tablet, Take 500 Units by mouth daily., Disp: , Rfl:    clopidogrel (PLAVIX) 75 MG tablet, Take by mouth., Disp: , Rfl:    docusate sodium (COLACE) 100 MG capsule, Take 100 mg by mouth daily., Disp: , Rfl:    ezetimibe (ZETIA) 10 MG tablet, TAKE ONE TABLET BY MOUTH DAILY AFTER SUPPER, Disp: 30 tablet, Rfl: 0   glucosamine-chondroitin 500-400 MG tablet, Take 1 tablet by mouth 4 (four) times daily., Disp: , Rfl:    lisinopril (PRINIVIL,ZESTRIL) 20 MG tablet, Take 20 mg by mouth daily., Disp: , Rfl:    Multiple Vitamins-Minerals (SENIOR MULTIVITAMIN PLUS) TABS, Take 1 tablet by mouth daily. , Disp: , Rfl:    Omega-3 Fatty Acids (FISH OIL) 1200 MG CPDR, Take 1 capsule by mouth daily. , Disp: , Rfl:    pregabalin (LYRICA) 75 MG capsule, Take 75 mg by mouth 2 (two) times daily., Disp: , Rfl:    solifenacin (VESICARE) 5 MG tablet, Take 1 tablet by mouth at bedtime., Disp: , Rfl:    triamcinolone (KENALOG) 0.025 % ointment, Apply 1 application topically daily as needed., Disp: , Rfl:     Radiology:   CTA Head 12/04/2018: 1.  Right carotid stent remains patent. There is narrowing of the lumen of the stent due to the calcified plaque resulting in about  a 50% stenosis. This is unchanged from the prior study. 2.  56% stenosis origin of the left internal carotid artery. 3.  Normal intracranial CT angiogram.  Cardiac Studies:   Echocardiogram 06/21/2019:  Frequent ectopy seen throughout the  study.  Left ventricle cavity is normal in size. Mild concentric hypertrophy of the left ventricle. Normal global wall motion. Normal LV systolic function with EF 55%.  Doppler evidence of grade I (impaired) diastolic dysfunction, normal LAP.  Trileaflet aortic valve with mild calcification. Mild aortic valve stenosis. Aortic valve mean gradient of 10 mmHg, Vmax of 2.1 m/s.  Calculated aortic valve area by continuity equation is 1.4 cm. No regurgitation.  Mild tricuspid regurgitation. Estimated pulmonary artery systolic pressure is 22 mmHg.  No significant change compared to previous study on 11/19/2016.  Carotid artery duplex 03/12/2022: Duplex suggests stenosis in the right internal carotid artery (50-69%) (in-stent restenosis). Duplex suggests stenosis in the left internal carotid artery (50-69%). Antegrade right vertebral artery flow. Antegrade left vertebral artery flow. Compared to 08/29/2021, no significant change. Follow up in six months is appropriate if clinically indicated.  EKG:  EKG 03/15/2022 sinus rhythm with first-degree block at rate of 55 bpm, left axis deviation, left anterior fascicular block.  Poor R wave progression, cannot exclude anterolateral infarct old.  Compared to 09/10/2021, no significant change.  Previous heart rate was 63 bpm.  Assessment     ICD-10-CM   1. Asymptomatic bilateral carotid artery stenosis  I65.23 EKG 12-Lead    PCV CAROTID DUPLEX (BILATERAL)    2. History of right common carotid artery stent placement  Z98.890 PCV CAROTID DUPLEX (BILATERAL)   Z95.828     3. Primary hypertension  I10     4. Hypercholesteremia  E78.00     5. Mild aortic stenosis  I35.0 PCV ECHOCARDIOGRAM COMPLETE      No orders of  the defined types were placed in this encounter.   Medications Discontinued During This Encounter  Medication Reason   pregabalin (LYRICA) 75 MG capsule      Recommendations:   Peggy [name]  is a 88 y.o. Caucasian female with very mild aortic stenosis, hypertension, peripheral neuropathy, bifascicular block on EKG and bradycardia, asymptomatic bilateral carotid stenosis and history of acute stroke s/p right ICA stenting is here for 33-monthoffice visit.  1. Asymptomatic bilateral carotid artery stenosis She will need continued surveillance of carotid artery stenosis.  She is on appropriate medical therapy with regard to carotid stenosis with clopidogrel, atorvastatin, lisinopril.  2. History of right common carotid artery stent placement Common carotid artery is patent and there is mild in-stent restenosis unchanged from prior carotid artery duplex.  Will repeat carotid artery duplex and follow-up on asymptomatic bilateral carotid stenosis in 6 months.  3. Primary hypertension Blood pressure is well-controlled.  I reviewed external labs, renal function has remained stable as well.  She does have sinus bradycardia and first-degree AV block by EKG that has remained stable along with left anterior fascicular block.  4. Hypercholesteremia Fortunately she has not had any TIA.  She is tolerating Plavix without any bleeding diathesis.  She is also on appropriate medical therapy for hyperlipidemia and lipids are well controlled. Labs reviewed.  5. Mild aortic stenosis Her last echocardiogram was in 2021, prior to next office visit in 6 months, we will recheck her aortic valve stenosis severity.   Blood pressure is also well controlled, no changes in the medications were done today, we will recheck her carotid artery duplex in 6 months and see her back at that time.    JAdrian Prows MD, [facility]3/01/2022, 2:40 PM Office: [phone]: [phone]

## 2022-03-29 DIAGNOSIS — N952 Postmenopausal atrophic vaginitis: Secondary | ICD-10-CM | POA: Diagnosis not present

## 2022-03-29 DIAGNOSIS — N3281 Overactive bladder: Secondary | ICD-10-CM | POA: Diagnosis not present

## 2022-03-29 DIAGNOSIS — N3941 Urge incontinence: Secondary | ICD-10-CM | POA: Diagnosis not present

## 2022-03-29 DIAGNOSIS — N8110 Cystocele, unspecified: Secondary | ICD-10-CM | POA: Diagnosis not present

## 2022-04-09 ENCOUNTER — Other Ambulatory Visit: Payer: Self-pay | Admitting: Cardiology

## 2022-04-09 DIAGNOSIS — E7800 Pure hypercholesterolemia, unspecified: Secondary | ICD-10-CM

## 2022-04-29 ENCOUNTER — Other Ambulatory Visit: Payer: Self-pay | Admitting: Cardiology

## 2022-04-29 DIAGNOSIS — E7800 Pure hypercholesterolemia, unspecified: Secondary | ICD-10-CM

## 2022-04-30 DIAGNOSIS — H52223 Regular astigmatism, bilateral: Secondary | ICD-10-CM | POA: Diagnosis not present

## 2022-04-30 DIAGNOSIS — H5203 Hypermetropia, bilateral: Secondary | ICD-10-CM | POA: Diagnosis not present

## 2022-04-30 DIAGNOSIS — H2513 Age-related nuclear cataract, bilateral: Secondary | ICD-10-CM | POA: Diagnosis not present

## 2022-04-30 DIAGNOSIS — H53042 Amblyopia suspect, left eye: Secondary | ICD-10-CM | POA: Diagnosis not present

## 2022-04-30 DIAGNOSIS — H04123 Dry eye syndrome of bilateral lacrimal glands: Secondary | ICD-10-CM | POA: Diagnosis not present

## 2022-06-04 ENCOUNTER — Other Ambulatory Visit: Payer: Self-pay | Admitting: Cardiology

## 2022-06-04 DIAGNOSIS — E7800 Pure hypercholesterolemia, unspecified: Secondary | ICD-10-CM

## 2022-07-16 ENCOUNTER — Other Ambulatory Visit: Payer: Self-pay

## 2022-07-16 DIAGNOSIS — E7800 Pure hypercholesterolemia, unspecified: Secondary | ICD-10-CM

## 2022-07-16 MED ORDER — EZETIMIBE 10 MG PO TABS: ORAL_TABLET | ORAL | 0 refills | Status: DC

## 2022-08-30 DIAGNOSIS — D225 Melanocytic nevi of trunk: Secondary | ICD-10-CM | POA: Diagnosis not present

## 2022-08-30 DIAGNOSIS — L570 Actinic keratosis: Secondary | ICD-10-CM | POA: Diagnosis not present

## 2022-08-30 DIAGNOSIS — L720 Epidermal cyst: Secondary | ICD-10-CM | POA: Diagnosis not present

## 2022-08-30 DIAGNOSIS — L578 Other skin changes due to chronic exposure to nonionizing radiation: Secondary | ICD-10-CM | POA: Diagnosis not present

## 2022-08-30 DIAGNOSIS — L821 Other seborrheic keratosis: Secondary | ICD-10-CM | POA: Diagnosis not present

## 2022-09-09 ENCOUNTER — Ambulatory Visit: Payer: Medicare Other

## 2022-09-09 DIAGNOSIS — I6523 Occlusion and stenosis of bilateral carotid arteries: Secondary | ICD-10-CM

## 2022-09-09 DIAGNOSIS — Z9889 Other specified postprocedural states: Secondary | ICD-10-CM | POA: Diagnosis not present

## 2022-09-09 DIAGNOSIS — Z95828 Presence of other vascular implants and grafts: Secondary | ICD-10-CM | POA: Diagnosis not present

## 2022-09-09 DIAGNOSIS — I350 Nonrheumatic aortic (valve) stenosis: Secondary | ICD-10-CM

## 2022-09-11 DIAGNOSIS — Z23 Encounter for immunization: Secondary | ICD-10-CM | POA: Diagnosis not present

## 2022-09-12 DIAGNOSIS — G609 Hereditary and idiopathic neuropathy, unspecified: Secondary | ICD-10-CM | POA: Diagnosis not present

## 2022-09-12 DIAGNOSIS — R7303 Prediabetes: Secondary | ICD-10-CM | POA: Diagnosis not present

## 2022-09-12 DIAGNOSIS — I10 Essential (primary) hypertension: Secondary | ICD-10-CM | POA: Diagnosis not present

## 2022-09-12 DIAGNOSIS — E785 Hyperlipidemia, unspecified: Secondary | ICD-10-CM | POA: Diagnosis not present

## 2022-09-18 ENCOUNTER — Ambulatory Visit: Payer: Medicare Other | Admitting: Cardiology

## 2022-09-18 ENCOUNTER — Encounter: Payer: Self-pay | Admitting: Cardiology

## 2022-09-18 VITALS — BP 134/60 | HR 57 | Resp 16 | Ht 67.0 in | Wt 196.2 lb

## 2022-09-18 DIAGNOSIS — Z9889 Other specified postprocedural states: Secondary | ICD-10-CM | POA: Diagnosis not present

## 2022-09-18 DIAGNOSIS — I350 Nonrheumatic aortic (valve) stenosis: Secondary | ICD-10-CM

## 2022-09-18 DIAGNOSIS — I6523 Occlusion and stenosis of bilateral carotid arteries: Secondary | ICD-10-CM

## 2022-09-18 DIAGNOSIS — I10 Essential (primary) hypertension: Secondary | ICD-10-CM

## 2022-09-18 DIAGNOSIS — Z95828 Presence of other vascular implants and grafts: Secondary | ICD-10-CM | POA: Diagnosis not present

## 2022-09-18 DIAGNOSIS — E7800 Pure hypercholesterolemia, unspecified: Secondary | ICD-10-CM

## 2022-09-18 NOTE — Progress Notes (Signed)
Caucasian female  Primary Physician/Referring:  Farris Has, MD  Patient ID: Peggy [name], female    DOB: [birth year], 88 y.o.   MRN: [id number]  Chief Complaint  Patient presents with   Asymptomatic bilateral carotid artery stenosi   Hypertension   Follow-up    6 month   HPI:    Peggy [name]  is a 88 y.o. Caucasian female with very mild aortic stenosis, hypertension, peripheral neuropathy, bifascicular block on EKG and bradycardia, asymptomatic bilateral carotid stenosis and history of acute stroke s/p right ICA stenting is presently on clopidogrel long-term,  presents here for annual office visit.  They have recently moved into [facility] assisted living facility.  Fortunately she is remained asymptomatic, continues to remain active, she has not had any recurrence of TIA-like symptoms, no dizziness or syncope.  She and her husband has now moved into [facility] assisted living place.  Past Medical History:  Diagnosis Date   Aortic stenosis    trace AS 05/31/14 echo (Dr. Jacinto Halim)   Arthritis    Dysrhythmia    Hyperlipidemia    Hypertension    Mitral valve prolapse    takes antiabiotic with procedures   Neuropathy    in feet   Osteoarthritis of left shoulder, primary localized 04/22/2014   Primary localized osteoarthrosis of right shoulder 11/29/2014   Stroke ([facility]) 04/23/2014   pt. states that she has no deficits.    Urinary urgency    Wears glasses    Past Surgical History:  Procedure Laterality Date   ANGIOPLASTY     BACK SURGERY     L5 removed 1986   CARPAL TUNNEL RELEASE Right 2014   COLONOSCOPY     JOINT REPLACEMENT     bilat knees   THROMBECTOMY     TOTAL SHOULDER ARTHROPLASTY Left 04/22/2014   Procedure: LEFT TOTAL SHOULDER ARTHROPLASTY;  Surgeon: Teryl Lucy, MD;  Location: [facility];  Service: Orthopedics;  Laterality: Left;   TOTAL SHOULDER ARTHROPLASTY Right 11/29/2014   Procedure: TOTAL SHOULDER ARTHROPLASTY;  Surgeon:  Teryl Lucy, MD;  Location: [facility];  Service: Orthopedics;  Laterality: Right;   Family History  Problem Relation Age of Onset   Diabetes Mother    Cancer Mother    Heart failure Father    Stroke Maternal Grandmother    Cancer Maternal Grandfather        unknown   Stroke Paternal Grandmother    Social History   Tobacco Use   Smoking status: Never   Smokeless tobacco: Never  Substance Use Topics   Alcohol use: Yes    Alcohol/week: 0.0 standard drinks of alcohol    Comment: occasionally   Marital Status: Married ROS  Review of Systems  Cardiovascular:  Negative for chest pain, dyspnea on exertion and leg swelling.   Objective  Blood pressure 134/60, pulse (!) 57, resp. rate 16, height 5\' 7"  (1.702 m), weight 196 lb 3.2 oz (89 kg), SpO2 94%.     09/18/2022    9:45 AM 03/15/2022    1:53 PM 09/10/2021   10:03 AM  Vitals with BMI  Height 5\' 7"  5\' 7"  5\' 7"   Weight 196 lbs 3 oz 196 lbs 6 oz 195 lbs 13 oz  BMI 30.72 30.75 30.66  Systolic 134 108 536  Diastolic 60 66 73  Pulse 57 59 60    Physical Exam Constitutional:      Appearance: She is well-developed.  Neck:     Thyroid: No thyromegaly.  Vascular: Carotid bruit (bilateral) present. No JVD.  Cardiovascular:     Rate and Rhythm: Normal rate and regular rhythm.     Pulses:          Popliteal pulses are 2+ on the right side and 2+ on the left side.       Dorsalis pedis pulses are 1+ on the right side and 1+ on the left side.       Posterior tibial pulses are 0 on the right side and 0 on the left side.     Heart sounds: S1 normal and S2 normal. Murmur heard.     Harsh midsystolic murmur is present with a grade of 3/6 at the upper right sternal border and apex radiating to the neck.     No gallop.  Pulmonary:     Effort: Pulmonary effort is normal.     Breath sounds: Normal breath sounds.  Abdominal:     General: Bowel sounds are normal.     Palpations: Abdomen is soft.  Musculoskeletal:     Right lower leg: No  edema.     Left lower leg: No edema.    Laboratory examination:   External Labs:  Labs 03/13/2022:  Total cholesterol 117, triglycerides 01/14/2013, HDL 45, LDL 51.  BUN 17, creatinine 0.91, EGFR 60 mL, potassium 4.5, LFTs normal.  TSH normal at 1.38.  A1c 6.0%.  Hb 14.7/HCT 44.3, platelets 261, normal indicis.  Medications and allergies  No Known Allergies    Current Outpatient Medications:    atorvastatin (LIPITOR) 80 MG tablet, Take 80 mg by mouth daily., Disp: , Rfl:    calcium carbonate 200 MG capsule, Take 250 mg by mouth 2 (two) times daily with a meal., Disp: , Rfl:    cholecalciferol (VITAMIN D) 1000 UNITS tablet, Take 500 Units by mouth daily., Disp: , Rfl:    clopidogrel (PLAVIX) 75 MG tablet, Take by mouth., Disp: , Rfl:    docusate sodium (COLACE) 100 MG capsule, Take 100 mg by mouth daily., Disp: , Rfl:    ezetimibe (ZETIA) 10 MG tablet, TAKE 1 TABLET BY MOUTH DAILY AFTER SUPPER, Disp: 90 tablet, Rfl: 0   glucosamine-chondroitin 500-400 MG tablet, Take 1 tablet by mouth 4 (four) times daily., Disp: , Rfl:    lisinopril (PRINIVIL,ZESTRIL) 20 MG tablet, Take 20 mg by mouth daily., Disp: , Rfl:    mirabegron ER (MYRBETRIQ) 50 MG TB24 tablet, Take 1 tablet by mouth daily., Disp: , Rfl:    Multiple Vitamins-Minerals (SENIOR MULTIVITAMIN PLUS) TABS, Take 1 tablet by mouth daily. , Disp: , Rfl:    Omega-3 Fatty Acids (FISH OIL) 1200 MG CPDR, Take 1 capsule by mouth daily. , Disp: , Rfl:    pregabalin (LYRICA) 75 MG capsule, Take 75 mg by mouth 2 (two) times daily., Disp: , Rfl:    solifenacin (VESICARE) 5 MG tablet, Take 1 tablet by mouth at bedtime., Disp: , Rfl:    triamcinolone (KENALOG) 0.025 % ointment, Apply 1 application topically daily as needed., Disp: , Rfl:     Radiology:   CTA Head 12/04/2018: 1.  Right carotid stent remains patent. There is narrowing of the lumen of the stent due to the calcified plaque resulting in about a 50% stenosis. This is unchanged  from the prior study. 2.  56% stenosis origin of the left internal carotid artery. 3.  Normal intracranial CT angiogram.  Cardiac Studies:   Echocardiogram 09/09/2022:  Normal LV systolic function with EF 57%. Left ventricle  cavity is normal in size. Mild concentric hypertrophy of the left ventricle. Normal global wall motion. Calculated EF 57%. Left atrial cavity is normal in size. Trileaflet aortic valve with no regurgitation. Moderate aortic valve leaflet calcification. Moderate aortic stenosis. Peak velocity  2.49 m/s, Peak Pressure Gradient  25 mmHg, Mean Gradient 11.5 mmHg, AVA 1.33cm, Dimensionless Index 0.5 Structurally normal tricuspid valve with trace regurgitation. Pericardium is normal. No evidence of significant pericardial effusion. Pericardial fat pad noted. No significant change from 06/21/2019 and 11/19/2016  Carotid artery duplex 09/09/2022: Duplex suggests stenosis in the right internal carotid artery (50-69%). <50% stenosis in the right external carotid artery. Duplex suggests stenosis in the left internal carotid artery (50-69%). There is diffuse mixed plaque throughout the bilateral carotid arteries.  Antegrade right vertebral artery flow. Antegrade left vertebral artery flow. No significant change since 03/12/2022. Follow up in six months is appropriate if clinically indicated.   EKG:  EKG 09/18/2022: Sinus rhythm with first-degree AV block at rate of 57 bpm, left anterior fascicular block.  Poor R progression, cannot exclude anterolateral infarct old.  Single PAC.  Compared to 03/15/2022, no significant change.  Assessment     ICD-10-CM   1. Asymptomatic bilateral carotid artery stenosis  I65.23 EKG 12-Lead    PCV CAROTID DUPLEX (BILATERAL)    2. History of right common carotid artery stent placement  Z98.890 PCV CAROTID DUPLEX (BILATERAL)   Z95.828     3. Hypercholesteremia  E78.00     4. Primary hypertension  I10     5. Mild aortic stenosis  I35.0       No  orders of the defined types were placed in this encounter.   There are no discontinued medications.    Recommendations:   Kailah Marron  is a 88 y.o. Caucasian female with very mild aortic stenosis, hypertension, peripheral neuropathy, bifascicular block on EKG and bradycardia, asymptomatic bilateral carotid stenosis and history of acute stroke s/p right ICA stenting is here for 2-month office visit.  1. Asymptomatic bilateral carotid artery stenosis I reviewed the carotid artery duplex, asymptomatic carotid stenosis has remained stable over the years.  She is now 88 years of age and continues to live independently and is doing remarkably well.  I will change her surveillance duplex to 1 year from now as she is eager and concerned about not following this.  - EKG 12-Lead - PCV CAROTID DUPLEX (BILATERAL); Future  2. History of right common carotid artery stent placement Carotid stent is patent.  She is presently on Plavix, continue the same.  No bleeding diathesis.  CBC has remained stable. - PCV CAROTID DUPLEX (BILATERAL); Future  3. Hypercholesteremia I reviewed her external labs, lipids under excellent control and she is presently on a statin, continue the same.  4. Primary hypertension She is on ACE inhibitor and blood pressure is very well-controlled, no changes were done today.  5. Mild aortic stenosis I reviewed her carotid artery duplex along with echocardiogram, she has very mild aortic stenosis, no change in physical exam.  I will see her back in a year or sooner if problems.  Other orders - mirabegron ER (MYRBETRIQ) 50 MG TB24 tablet; Take 1 tablet by mouth daily.     Yates Decamp, MD, [facility] 09/18/2022, 10:37 AM Office: [phone] Pager: [phone]

## 2022-10-12 ENCOUNTER — Other Ambulatory Visit: Payer: Self-pay | Admitting: Cardiology

## 2022-10-12 DIAGNOSIS — E7800 Pure hypercholesterolemia, unspecified: Secondary | ICD-10-CM

## 2023-03-20 DIAGNOSIS — E785 Hyperlipidemia, unspecified: Secondary | ICD-10-CM | POA: Diagnosis not present

## 2023-03-20 DIAGNOSIS — Z7901 Long term (current) use of anticoagulants: Secondary | ICD-10-CM | POA: Diagnosis not present

## 2023-03-20 DIAGNOSIS — Z0000 Encounter for general adult medical examination without abnormal findings: Secondary | ICD-10-CM | POA: Diagnosis not present

## 2023-03-20 DIAGNOSIS — R7303 Prediabetes: Secondary | ICD-10-CM | POA: Diagnosis not present

## 2023-03-25 DIAGNOSIS — E785 Hyperlipidemia, unspecified: Secondary | ICD-10-CM | POA: Diagnosis not present

## 2023-03-25 DIAGNOSIS — G609 Hereditary and idiopathic neuropathy, unspecified: Secondary | ICD-10-CM | POA: Diagnosis not present

## 2023-03-25 DIAGNOSIS — Z8673 Personal history of transient ischemic attack (TIA), and cerebral infarction without residual deficits: Secondary | ICD-10-CM | POA: Diagnosis not present

## 2023-03-25 DIAGNOSIS — Z0000 Encounter for general adult medical examination without abnormal findings: Secondary | ICD-10-CM | POA: Diagnosis not present

## 2023-03-25 DIAGNOSIS — I10 Essential (primary) hypertension: Secondary | ICD-10-CM | POA: Diagnosis not present

## 2023-03-25 DIAGNOSIS — R7303 Prediabetes: Secondary | ICD-10-CM | POA: Diagnosis not present

## 2023-04-04 DIAGNOSIS — H2513 Age-related nuclear cataract, bilateral: Secondary | ICD-10-CM | POA: Diagnosis not present

## 2023-04-04 DIAGNOSIS — H5203 Hypermetropia, bilateral: Secondary | ICD-10-CM | POA: Diagnosis not present

## 2023-06-27 DIAGNOSIS — N3941 Urge incontinence: Secondary | ICD-10-CM | POA: Diagnosis not present

## 2023-06-27 DIAGNOSIS — R351 Nocturia: Secondary | ICD-10-CM | POA: Diagnosis not present

## 2023-06-27 DIAGNOSIS — N3281 Overactive bladder: Secondary | ICD-10-CM | POA: Diagnosis not present

## 2023-07-10 ENCOUNTER — Other Ambulatory Visit: Payer: Self-pay | Admitting: Cardiology

## 2023-07-10 DIAGNOSIS — E7800 Pure hypercholesterolemia, unspecified: Secondary | ICD-10-CM

## 2023-08-29 DIAGNOSIS — D225 Melanocytic nevi of trunk: Secondary | ICD-10-CM | POA: Diagnosis not present

## 2023-08-29 DIAGNOSIS — L578 Other skin changes due to chronic exposure to nonionizing radiation: Secondary | ICD-10-CM | POA: Diagnosis not present

## 2023-08-29 DIAGNOSIS — L720 Epidermal cyst: Secondary | ICD-10-CM | POA: Diagnosis not present

## 2023-08-29 DIAGNOSIS — L570 Actinic keratosis: Secondary | ICD-10-CM | POA: Diagnosis not present

## 2023-08-29 DIAGNOSIS — L821 Other seborrheic keratosis: Secondary | ICD-10-CM | POA: Diagnosis not present

## 2023-09-05 DIAGNOSIS — Z23 Encounter for immunization: Secondary | ICD-10-CM | POA: Diagnosis not present

## 2023-09-09 ENCOUNTER — Ambulatory Visit: Payer: Self-pay | Admitting: Cardiology

## 2023-09-09 ENCOUNTER — Ambulatory Visit (HOSPITAL_COMMUNITY)
Admission: RE | Admit: 2023-09-09 | Discharge: 2023-09-09 | Disposition: A | Discharge status: Home / Self Care | Payer: Medicare Other | Source: Ambulatory Visit | Attending: Cardiology | Admitting: Cardiology

## 2023-09-09 DIAGNOSIS — Z95828 Presence of other vascular implants and grafts: Secondary | ICD-10-CM | POA: Diagnosis not present

## 2023-09-09 DIAGNOSIS — I6523 Occlusion and stenosis of bilateral carotid arteries: Secondary | ICD-10-CM | POA: Diagnosis not present

## 2023-09-09 DIAGNOSIS — Z9889 Other specified postprocedural states: Secondary | ICD-10-CM | POA: Insufficient documentation

## 2023-09-09 NOTE — Progress Notes (Signed)
 Carotid artery duplex 09/09/2023:   Right Carotid: The extracranial vessels were near-normal with only minimal wall thickening or plaque. Patent right distal CCA-mid ICA stent with  slightly elevated velocities in the mid portion. Left Carotid: Velocities in the left ICA are consistent with a 40-59% stenosis. Vertebrals:  Bilateral vertebral arteries demonstrate antegrade flow. Subclavians: Normal flow hemodynamics were seen in bilateral subclavian arteries. NO significant change since 09/09/22. Recheck in 1 year if clinically indicated.   Will discuss on OV soon

## 2023-09-25 DIAGNOSIS — E785 Hyperlipidemia, unspecified: Secondary | ICD-10-CM | POA: Diagnosis not present

## 2023-09-25 DIAGNOSIS — I10 Essential (primary) hypertension: Secondary | ICD-10-CM | POA: Diagnosis not present

## 2023-09-25 DIAGNOSIS — G609 Hereditary and idiopathic neuropathy, unspecified: Secondary | ICD-10-CM | POA: Diagnosis not present

## 2023-09-25 DIAGNOSIS — R7303 Prediabetes: Secondary | ICD-10-CM | POA: Diagnosis not present

## 2023-10-09 ENCOUNTER — Other Ambulatory Visit: Payer: Self-pay

## 2023-10-09 DIAGNOSIS — E7800 Pure hypercholesterolemia, unspecified: Secondary | ICD-10-CM

## 2023-10-09 MED ORDER — EZETIMIBE 10 MG PO TABS
ORAL_TABLET | ORAL | 0 refills | Status: DC
Start: 2023-10-09 — End: 2023-11-05

## 2023-10-20 ENCOUNTER — Encounter: Payer: Self-pay | Admitting: Cardiology

## 2023-10-20 ENCOUNTER — Ambulatory Visit: Attending: Cardiology | Admitting: Cardiology

## 2023-10-20 VITALS — BP 118/80 | HR 64 | Ht 68.0 in | Wt 196.2 lb

## 2023-10-20 DIAGNOSIS — I350 Nonrheumatic aortic (valve) stenosis: Secondary | ICD-10-CM | POA: Insufficient documentation

## 2023-10-20 DIAGNOSIS — I6523 Occlusion and stenosis of bilateral carotid arteries: Secondary | ICD-10-CM | POA: Diagnosis not present

## 2023-10-20 DIAGNOSIS — Z95828 Presence of other vascular implants and grafts: Secondary | ICD-10-CM | POA: Diagnosis not present

## 2023-10-20 DIAGNOSIS — E7800 Pure hypercholesterolemia, unspecified: Secondary | ICD-10-CM | POA: Diagnosis not present

## 2023-10-20 DIAGNOSIS — I10 Essential (primary) hypertension: Secondary | ICD-10-CM | POA: Insufficient documentation

## 2023-10-20 DIAGNOSIS — Z9889 Other specified postprocedural states: Secondary | ICD-10-CM | POA: Diagnosis not present

## 2023-10-20 NOTE — Patient Instructions (Addendum)
 Thank you for choosing [facility]!     Medication Instructions:  No medication changes were made during today's visit.  *If you need a refill on your cardiac medications before your next appointment, please call your pharmacy*   Lab Work: No labs were ordered during today's visit.  If you have labs (blood work) drawn today and your tests are completely normal, you will receive your results only by: MyChart Message (if you have MyChart) OR A paper copy in the mail If you have any lab test that is abnormal or we need to change your treatment, we will call you to review the results.   Testing/Procedures: Your physician has requested that you have an echocardiogram. Echocardiography is a painless test that uses sound waves to create images of your heart. It provides your doctor with information about the size and shape of your heart and how well your heart's chambers and valves are working. This procedure takes approximately one hour. There are no restrictions for this procedure. Please do NOT wear cologne, perfume, aftershave, or lotions (deodorant is allowed). Please arrive 15 minutes prior to your appointment time.  Please note: We ask at that you not bring children with you during ultrasound (echo/ vascular) testing. Due to room size and safety concerns, children are not allowed in the ultrasound rooms during exams. Our front office staff cannot provide observation of children in our lobby area while testing is being conducted. An adult accompanying a patient to their appointment will only be allowed in the ultrasound room at the discretion of the ultrasound technician under special circumstances. We apologize for any inconvenience.   Your next appointment:   1 year(s) A letter will be mailed to you as a reminder to call the office for your next follow up appointment.    Provider:   Gordy Bergamo, MD   Follow-Up: At [facility], you and your health needs are our  priority.  As part of our continuing mission to provide you with exceptional heart care, we have created designated Provider Care Teams.  These Care Teams include your primary Cardiologist (physician) and Advanced Practice Providers (APPs -  Physician Assistants and Nurse Practitioners) who all work together to provide you with the care you need, when you need it. We recommend signing up for the patient portal called MyChart.  Sign up information is provided on this After Visit Summary.  MyChart is used to connect with patients for Virtual Visits (Telemedicine).  Patients are able to view lab/test results, encounter notes, upcoming appointments, etc.  Non-urgent messages can be sent to your provider as well.   To learn more about what you can do with MyChart, go to [email or web address].

## 2023-10-20 NOTE — Progress Notes (Signed)
 Cardiology Office Note:  .   Date:  10/20/2023  ID:  Peggy [name], DOB May 26, 1934, MRN [id number] PCP: Kip Righter, MD  [facility] Providers Cardiologist:  None   History of Present Illness: .   Peggy [name] is a 89 y.o. Caucasian female with very mild aortic stenosis, hypertension, peripheral neuropathy, bifascicular block on EKG and bradycardia, asymptomatic bilateral carotid stenosis and history of acute stroke s/p right ICA stenting is presently on clopidogrel  long-term, presents here for annual office visit. She lives at [facility] assisted living facility.  She has had a remote nuclear stress test in 2016 which is nonischemic.  Cardiac Studies relevent.    Carotid artery duplex 09/09/2023:   Right Carotid: The extracranial vessels were near-normal with only minimal wall thickening or plaque. Patent right distal CCA-mid ICA stent with  slightly elevated velocities in the mid portion. Left Carotid: Velocities in the left ICA are consistent with a 40-59% stenosis. Vertebrals:  Bilateral vertebral arteries demonstrate antegrade flow. Subclavians: Normal flow hemodynamics were seen in bilateral subclavian arteries. NO significant change since 09/09/22. Recheck in 1 year if clinically indicated.   Echocardiogram 09/09/2022:  Normal LV systolic function with EF 57%. Left ventricle cavity is normal in size. Mild concentric hypertrophy of the left ventricle. Normal global wall motion. Calculated EF 57%. Left atrial cavity is normal in size. Trileaflet aortic valve with no regurgitation. Moderate aortic valve leaflet calcification. Moderate aortic stenosis. Peak velocity  2.49 m/s, Peak Pressure Gradient  25 mmHg, Mean Gradient 11.5 mmHg, AVA 1.33cm, Dimensionless Index 0.5    Discussed the use of AI scribe software for clinical note transcription with the patient, who gave verbal consent to proceed.  History of Present Illness Peggy [name] is an 89 year  old female who presents for an annual cardiovascular follow-up.  Atherosclerotic cardiovascular disease - History of right carotid artery stent placement - Mild to moderate stenosis of the left carotid artery - LDL cholesterol 56 mg/dL on most recent laboratory evaluation - Takes atorvastatin  80 mg and ezetimibe  10 mg daily - Takes clopidogrel  75 mg daily without adverse effects  Blood pressure control - Blood pressure 118/80 mmHg at last check - Takes lisinopril  20 mg daily  Orthostatic dizziness - Dizziness occurs only when rising quickly from a lying position - No dizziness at rest or with gradual position changes  Peripheral neuropathy - Neuropathy in the feet limits some activities  Chronic kidney disease - Stage 3A chronic kidney disease  Functional status and physical activity - Walks for 30 minutes daily, five to six days per week - Maintains an active lifestyle - Lives with her husband   Labs  Care everywhere/Faxed External Labs:  Labs 09/29/2023:  Total cholesterol 120, triglycerides 129, HDL 41, LDL 56.  Serum glucose 93 mg, BUN 17, creatinine 1.14, eGFR 47 mL, potassium 4.5, LFTs normal.  Hb 15.1/HCT 44.0, platelets 281, normal indicis.  A1c 5.9%.  TSH normal at 1.38.  ROS  Review of Systems  Cardiovascular:  Negative for chest pain, dyspnea on exertion and leg swelling.   Physical Exam:   VS:  BP 118/80   Pulse 64   Ht 5' 8 (1.727 m)   Wt 196 lb 3.2 oz (89 kg)   LMP  (LMP Unknown)   SpO2 95%   BMI 29.83 kg/m    Wt Readings from Last 3 Encounters:  10/20/23 196 lb 3.2 oz (89 kg)  09/18/22 196 lb 3.2 oz (89 kg)  03/15/22  196 lb 6.4 oz (89.1 kg)    BP Readings from Last 3 Encounters:  10/20/23 118/80  09/18/22 134/60  03/15/22 108/66   Physical Exam Neck:     Vascular: No JVD.  Cardiovascular:     Rate and Rhythm: Normal rate and regular rhythm.     Pulses: Intact distal pulses.          Carotid pulses are  on the right side with bruit  and  on the left side with bruit.    Heart sounds: S1 normal and S2 normal. Murmur heard.     Midsystolic murmur is present with a grade of 3/6 at the upper right sternal border.     No gallop.  Pulmonary:     Effort: Pulmonary effort is normal.     Breath sounds: Normal breath sounds.  Abdominal:     General: Bowel sounds are normal.     Palpations: Abdomen is soft.  Musculoskeletal:     Right lower leg: No edema.     Left lower leg: No edema.    EKG:    EKG Interpretation Date/Time:  Monday October 20 2023 11:27:22 EDT Ventricular Rate:  64 PR Interval:  266 QRS Duration:  102 QT Interval:  390 QTC Calculation: 402 R Axis:   -48  Text Interpretation: EKG 10/20/2023: Sinus rhythm first-degree block at rate of 64 bpm, left anterior fascicular block.  Poor R wave progression, anterolateral infarct old. Low-voltage complexes.  Compared to 04/12/2014, no significant change. Confirmed by Aika Brzoska, Jagadeesh ([id number]) on 10/20/2023 11:33:33 AM    ASSESSMENT AND PLAN: .      ICD-10-CM   1. Primary hypertension  I10 EKG 12-Lead    2. Asymptomatic bilateral carotid artery stenosis  I65.23     3. History of right common carotid artery stent placement  Z98.890    Z95.828     4. Hypercholesteremia  E78.00     5. Mild aortic stenosis  I35.0       Assessment and Plan Assessment & Plan Aortic valve stenosis, mild to moderate Mild to moderate aortic valve stenosis without symptoms such as dyspnea or syncope, indicating non-critical status. - Order echocardiogram before next annual visit to monitor aortic stenosis - Instruct her to report any new symptoms such as dyspnea or near-syncope immediately  Carotid artery disease, status post right carotid stent and left mild to moderate stenosis Status post right carotid stent placement with mild to moderate stenosis in the left carotid artery, currently asymptomatic. - Order carotid duplex ultrasound before next annual visit to monitor carotid  artery status - Continue Plavix  75 mg once daily  Essential hypertension, well controlled Hypertension is well controlled with current medication regimen.  - Continue lisinopril  20 mg once daily  Pure hypercholesterolemia, well controlled Hypercholesterolemia is well controlled with current medication regimen. LDL cholesterol is at 56 mg/dL. - Continue atorvastatin  80 mg once daily - Continue Zetia  10 mg once daily  Chronic kidney disease stage 3a Chronic kidney disease stage 3a with mildly reduced kidney function, expected for her age. Potassium levels are within normal range.   Follow up: 1 year with repeat echocardiogram and carotid artery duplex.  Signed,  Gordy Bergamo, MD, [facility] 10/20/2023, 11:33 AM [facility] [address] [place], [place] [address] Phone: [phone]. Fax:  [phone]

## 2023-11-05 ENCOUNTER — Other Ambulatory Visit: Payer: Self-pay

## 2023-11-05 DIAGNOSIS — E7800 Pure hypercholesterolemia, unspecified: Secondary | ICD-10-CM

## 2023-11-07 MED ORDER — EZETIMIBE 10 MG PO TABS: ORAL_TABLET | ORAL | 3 refills | Status: AC

## 2024-09-06 ENCOUNTER — Encounter (HOSPITAL_COMMUNITY)

## 2024-09-06 ENCOUNTER — Other Ambulatory Visit (HOSPITAL_COMMUNITY)
